# Patient Record
Sex: Female | Born: 1975
Health system: Southern US, Community
[De-identification: ages and names within clinical notes are randomized; demographics above are authoritative.]

## PROBLEM LIST (undated history)

## (undated) DIAGNOSIS — E785 Hyperlipidemia, unspecified: Secondary | ICD-10-CM

## (undated) DIAGNOSIS — R809 Proteinuria, unspecified: Secondary | ICD-10-CM

## (undated) DIAGNOSIS — E1165 Type 2 diabetes mellitus with hyperglycemia: Secondary | ICD-10-CM

## (undated) DIAGNOSIS — Z6841 Body Mass Index (BMI) 40.0 and over, adult: Secondary | ICD-10-CM

## (undated) HISTORY — DX: Hyperlipidemia, unspecified: E78.5

## (undated) HISTORY — DX: Body Mass Index (BMI) 40.0 and over, adult: Z684

## (undated) HISTORY — DX: Morbid (severe) obesity due to excess calories: E66.01

## (undated) HISTORY — DX: Proteinuria, unspecified: R80.9

## (undated) HISTORY — DX: Type 2 diabetes mellitus with hyperglycemia: E11.65

---

## 2007-05-21 DIAGNOSIS — E1165 Type 2 diabetes mellitus with hyperglycemia: Secondary | ICD-10-CM

## 2007-05-21 HISTORY — DX: Type 2 diabetes mellitus with hyperglycemia: E11.65

## 2007-06-28 HISTORY — PX: TUBAL LIGATION: SHX77

## 2012-10-12 ENCOUNTER — Emergency Department (HOSPITAL_COMMUNITY)
Admission: EM | Admit: 2012-10-12 | Discharge: 2012-10-12 | Disposition: A | Discharge status: Home / Self Care | Payer: Self-pay | Attending: Emergency Medicine | Admitting: Emergency Medicine

## 2012-10-12 ENCOUNTER — Encounter (HOSPITAL_COMMUNITY): Payer: Self-pay | Admitting: Emergency Medicine

## 2012-10-12 ENCOUNTER — Emergency Department (HOSPITAL_COMMUNITY): Payer: Self-pay

## 2012-10-12 DIAGNOSIS — R0781 Pleurodynia: Secondary | ICD-10-CM

## 2012-10-12 DIAGNOSIS — IMO0002 Reserved for concepts with insufficient information to code with codable children: Secondary | ICD-10-CM | POA: Insufficient documentation

## 2012-10-12 DIAGNOSIS — M25561 Pain in right knee: Secondary | ICD-10-CM

## 2012-10-12 DIAGNOSIS — S99919A Unspecified injury of unspecified ankle, initial encounter: Secondary | ICD-10-CM | POA: Insufficient documentation

## 2012-10-12 DIAGNOSIS — E669 Obesity, unspecified: Secondary | ICD-10-CM | POA: Insufficient documentation

## 2012-10-12 DIAGNOSIS — S298XXA Other specified injuries of thorax, initial encounter: Secondary | ICD-10-CM | POA: Insufficient documentation

## 2012-10-12 DIAGNOSIS — M7918 Myalgia, other site: Secondary | ICD-10-CM

## 2012-10-12 DIAGNOSIS — Y92009 Unspecified place in unspecified non-institutional (private) residence as the place of occurrence of the external cause: Secondary | ICD-10-CM | POA: Insufficient documentation

## 2012-10-12 DIAGNOSIS — S0990XA Unspecified injury of head, initial encounter: Secondary | ICD-10-CM | POA: Insufficient documentation

## 2012-10-12 DIAGNOSIS — S8990XA Unspecified injury of unspecified lower leg, initial encounter: Secondary | ICD-10-CM | POA: Insufficient documentation

## 2012-10-12 DIAGNOSIS — R296 Repeated falls: Secondary | ICD-10-CM | POA: Insufficient documentation

## 2012-10-12 DIAGNOSIS — Y9389 Activity, other specified: Secondary | ICD-10-CM | POA: Insufficient documentation

## 2012-10-12 DIAGNOSIS — S59909A Unspecified injury of unspecified elbow, initial encounter: Secondary | ICD-10-CM | POA: Insufficient documentation

## 2012-10-12 DIAGNOSIS — S6990XA Unspecified injury of unspecified wrist, hand and finger(s), initial encounter: Secondary | ICD-10-CM | POA: Insufficient documentation

## 2012-10-12 MED ORDER — CYCLOBENZAPRINE HCL 10 MG PO TABS: 10.0000 mg | ORAL_TABLET | Freq: Two times a day (BID) | ORAL | Status: DC | PRN

## 2012-10-12 MED ORDER — OXYCODONE-ACETAMINOPHEN 5-325 MG PO TABS
2.0000 | ORAL_TABLET | Freq: Once | ORAL | Status: AC
  Administered 2012-10-12: 2 via ORAL
  Filled 2012-10-12: qty 2

## 2012-10-12 MED ORDER — NAPROXEN 500 MG PO TABS: 500.0000 mg | ORAL_TABLET | Freq: Two times a day (BID) | ORAL | Status: DC

## 2012-10-12 NOTE — ED Provider Notes (Signed)
History     CSN: [id number]  Arrival date & time 10/12/12  8295   First MD Initiated Contact with Patient 10/12/12 0957      Chief Complaint  Patient presents with  . Fall    (Consider location/radiation/quality/duration/timing/severity/associated sxs/prior treatment) HPI Comments: 37 y.o. Female with no medical hx presents today s/p falling in her home on Saturday. No LOC. Pt has been ambulatory since the fall, though she states pain is increasing. Her chief complaint is severe pain to the left rib area and right knee with minor pain to the top of her head, lower back, and left wrist. Pt states it hurts to take a deep breath since the fall and she fears the rib is broken.   Patient is a 37 y.o. female presenting with fall. The history is provided by the patient.  Fall This is a new problem. Episode onset: 2 days ago. The problem occurs constantly. The problem has been gradually worsening. Associated symptoms include arthralgias. Pertinent negatives include no abdominal pain, chest pain, chills, coughing, diaphoresis, fever, headaches, nausea, neck pain, numbness, urinary symptoms, visual change, vomiting or weakness. Associated symptoms comments: Right knee, left wrist. Exacerbated by: moving around, deep breath. She has tried NSAIDs for the symptoms. The treatment provided no relief.    History reviewed. No pertinent past medical history.  Past Surgical History  Procedure Laterality Date  . Cesarean section      No family history on file.  History  Substance Use Topics  . Smoking status: Never Smoker   . Smokeless tobacco: Not on file  . Alcohol Use: No    OB History   Grav Para Term Preterm Abortions TAB SAB Ect Mult Living                  Review of Systems  Constitutional: Negative for fever, chills and diaphoresis.  HENT: Negative for neck pain.   Respiratory: Negative for cough.   Cardiovascular: Negative for chest pain.  Gastrointestinal: Negative for nausea,  vomiting and abdominal pain.  Musculoskeletal: Positive for arthralgias.  Neurological: Negative for weakness, numbness and headaches.    Allergies  Review of patient's allergies indicates no known allergies.  Home Medications  No current outpatient prescriptions on file.  BP 130/46  Pulse 75  Temp(Src) 98 F (36.7 C) (Oral)  Resp 22  SpO2 98%  Physical Exam  Nursing note and vitals reviewed. Constitutional: She is oriented to person, place, and time. She appears well-developed and well-nourished. No distress.  obese  HENT:  Head: Normocephalic and atraumatic.  Eyes: Conjunctivae and EOM are normal. Pupils are equal, round, and reactive to light.  Neck: Normal range of motion. Neck supple.  No meningeal signs  Cardiovascular: Normal rate, regular rhythm, normal heart sounds and intact distal pulses.  Exam reveals no gallop and no friction rub.   No murmur heard. Pulmonary/Chest: Effort normal and breath sounds normal. No respiratory distress. She has no wheezes. She has no rales. She exhibits tenderness.  Left sided pain on palpation to lateral chest wall. Pt unable to take deep breaths due to pain, but not tachypnic on exam with resps 18-20  Abdominal: Soft. Bowel sounds are normal. She exhibits no distension. There is no tenderness. There is no rebound and no guarding.  Musculoskeletal: Normal range of motion. She exhibits no edema and no tenderness.  5/5 strength throughout. Mild swelling to right knee. No erythema. No warmth. No effusion. Good quadricep strength on straight leg raise. No joint  laxity.  Neurological: She is alert and oriented to person, place, and time. No cranial nerve deficit.  Sensation normal to light touch and two point discrimination Moves extremities without ataxia, coordination intact Normal gait and balance, slight limp Normal strength in upper and lower extremities bilaterally including dorsiflexion and plantar flexion, strong and equal grip  strength   Skin: Skin is warm and dry. She is not diaphoretic. No erythema.  Psychiatric: She has a normal mood and affect.    ED Course  Procedures (including critical care time) Medications  oxyCODONE-acetaminophen (PERCOCET/ROXICET) 5-325 MG per tablet 2 tablet (2 tablets Oral Given 10/12/12 1120)    Labs Reviewed - No data to display Dg Ribs Unilateral W/chest Left  10/12/2012   *RADIOLOGY REPORT*  Clinical Data: Fall.  Short of breath.  Left anterior rib pain.  LEFT RIBS AND CHEST - 3+ VIEW  Comparison: None.  Findings: Heart size is at the upper limits of normal.  Mediastinal shadows are normal.  The lungs are clear allowing for the poor inspiration.  No pneumothorax or hemothorax.  Left rib films do not show any rib fracture.  IMPRESSION: Negative radiographs   Original Report Authenticated By: Paulina Fusi, M.D.   Dg Knee Complete 4 Views Right  10/12/2012   *RADIOLOGY REPORT*  Clinical Data: Larey Seat.  Right knee pain.  RIGHT KNEE - COMPLETE 4+ VIEW  Comparison: None  Findings: The joint spaces are maintained.  No acute fracture or joint effusion.  IMPRESSION: No acute bony findings, degenerative changes or joint effusion.   Original Report Authenticated By: Rudie Meyer, M.D.     1. Right knee pain   2. Rib pain on left side   3. Musculoskeletal pain       MDM  Imaging shows no fracture. Knee immobilizer and crutches ordered. Directed pt to ice, elevate and rest the injury when possible. Sent home with Naprosyn and Flexerill.  At this time there does not appear to be any evidence of an acute emergency medical condition and the patient appears stable for discharge with appropriate outpatient follow up.Diagnosis was discussed with patient who verbalizes understanding and is agreeable to discharge.   Glade Nurse, PA-C 10/12/12 2333

## 2012-10-12 NOTE — ED Notes (Signed)
Pt reports falling in her kitchen Saturday morning onto her left side hurting her left knee, hip, ribs and head. No LOC and reports pain has not gotten better. Denies n/v. NAD at this time

## 2012-10-13 NOTE — ED Provider Notes (Signed)
Medical screening examination/treatment/procedure(s) were performed by non-physician practitioner and as supervising physician I was immediately available for consultation/collaboration.   Charlane Westry Y. Makaylin Carlo, MD 10/13/12 0821 

## 2013-01-22 ENCOUNTER — Ambulatory Visit: Payer: Self-pay | Admitting: Family Medicine

## 2013-01-22 VITALS — BP 114/74 | HR 65 | Temp 98.8°F | Resp 20 | Ht 60.0 in | Wt 250.0 lb

## 2013-01-22 DIAGNOSIS — L309 Dermatitis, unspecified: Secondary | ICD-10-CM

## 2013-01-22 DIAGNOSIS — N61 Mastitis without abscess: Secondary | ICD-10-CM

## 2013-01-22 DIAGNOSIS — L259 Unspecified contact dermatitis, unspecified cause: Secondary | ICD-10-CM

## 2013-01-22 DIAGNOSIS — N611 Abscess of the breast and nipple: Secondary | ICD-10-CM

## 2013-01-22 MED ORDER — DOXYCYCLINE HYCLATE 100 MG PO TABS: 100.0000 mg | ORAL_TABLET | Freq: Two times a day (BID) | ORAL | Status: DC

## 2013-01-22 MED ORDER — TRIAMCINOLONE ACETONIDE 0.1 % EX CREA: TOPICAL_CREAM | Freq: Two times a day (BID) | CUTANEOUS | Status: DC

## 2013-01-22 NOTE — Patient Instructions (Addendum)
Take the doxycycline twice a day for your breast infection.   We will be in touch regarding your mammogram referral Try the cream on your feet twice a day.    Be sure to let me know if you do not hear about your mammogram appointment!

## 2013-01-22 NOTE — Progress Notes (Signed)
[facility] [address], [place] [address] [phone]  Date:  01/22/2013   Name:  Mackenzie Ford   DOB:  [birth year]   MRN:  [id number]  PCP:  No PCP Per Patient    Chief Complaint: Breast Pain   History of Present Illness:  Mackenzie Ford is a 37 y.o. very pleasant female patient who presents with the following:  She has noted a lump in her breast for about 2 weeks. It does hurt, and has bled.  It got bigger and more sore, then seemed to burst and now is less tender.   Otherwise she feels well.   Her LMP was about 2 weeks ago- she has had a BTL so there is no current risk of pregnancy She is otherwise healthy except for obesity  There are no active problems to display for this patient.   History reviewed. No pertinent past medical history.  Past Surgical History  Procedure Laterality Date  . Cesarean section      History  Substance Use Topics  . Smoking status: Never Smoker   . Smokeless tobacco: Not on file  . Alcohol Use: No    History reviewed. No pertinent family history.  No Known Allergies  Medication list has been reviewed and updated.  Current Outpatient Prescriptions on File Prior to Visit  Medication Sig Dispense Refill  . cyclobenzaprine (FLEXERIL) 10 MG tablet Take 1 tablet (10 mg total) by mouth 2 (two) times daily as needed for muscle spasms.  20 tablet  0  . naproxen (NAPROSYN) 500 MG tablet Take 1 tablet (500 mg total) by mouth 2 (two) times daily with a meal.  30 tablet  1   No current facility-administered medications on file prior to visit.    Review of Systems:  As per HPI- otherwise negative.   Physical Examination: Filed Vitals:   01/22/13 1152  BP: 114/74  Pulse: 65  Temp: 98.8 F (37.1 C)  Resp: 20   Filed Vitals:   01/22/13 1152  Height: 5' (1.524 m)  Weight: 250 lb (113.399 kg)   Body mass index is 48.82 kg/(m^2). Ideal Body Weight: Weight in (lb) to have BMI = 25: 127.7  GEN: WDWN, NAD,  Non-toxic, A & O x 3, morbid obesity HEENT: Atraumatic, Normocephalic. Neck supple. No masses, No LAD. Ears and Nose: No external deformity. CV: RRR, No M/G/R. No JVD. No thrill. No extra heart sounds. PULM: CTA B, no wheezes, crackles, rhonchi. No retractions. No resp. distress. No accessory muscle use. Breast; the right breast shows an already spontaneously drained abscess at 6:00.  There is no persistent drainage, no redness or heat.   ABD: S, NT, ND, +BS. No rebound. No HSM. EXTR: No c/c/e NEURO Normal gait.  PSYCH: Normally interactive. Conversant. Not depressed or anxious appearing.  Calm demeanor.  She is also concerned about chronic dermatitis on her bilateral feet  Assessment and Plan: Abscess of breast - Plan: doxycycline (VIBRA-TABS) 100 MG tablet, Ambulatory referral to Breast Clinic  Chronic dermatitis of feet - Plan: triamcinolone cream (KENALOG) 0.1 %  Suspect her breast lesion represents a spontaneously drained abscess.  Cover with doxycycline.  Will refer for a mammogram Trial of triamcinolone for chronic dermatitis of her feet.    Signed Abbe Amsterdam, MD

## 2013-11-25 ENCOUNTER — Emergency Department (HOSPITAL_COMMUNITY)
Admission: EM | Admit: 2013-11-25 | Discharge: 2013-11-26 | Disposition: A | Discharge status: Home / Self Care | Payer: Self-pay | Attending: Emergency Medicine | Admitting: Emergency Medicine

## 2013-11-25 ENCOUNTER — Encounter (HOSPITAL_COMMUNITY): Payer: Self-pay | Admitting: Emergency Medicine

## 2013-11-25 DIAGNOSIS — N949 Unspecified condition associated with female genital organs and menstrual cycle: Secondary | ICD-10-CM | POA: Insufficient documentation

## 2013-11-25 DIAGNOSIS — N938 Other specified abnormal uterine and vaginal bleeding: Secondary | ICD-10-CM | POA: Insufficient documentation

## 2013-11-25 DIAGNOSIS — Z3183 Encounter for assisted reproductive fertility procedure cycle: Secondary | ICD-10-CM | POA: Insufficient documentation

## 2013-11-25 DIAGNOSIS — Z3202 Encounter for pregnancy test, result negative: Secondary | ICD-10-CM | POA: Insufficient documentation

## 2013-11-25 DIAGNOSIS — N939 Abnormal uterine and vaginal bleeding, unspecified: Secondary | ICD-10-CM

## 2013-11-25 DIAGNOSIS — Z9889 Other specified postprocedural states: Secondary | ICD-10-CM | POA: Insufficient documentation

## 2013-11-25 DIAGNOSIS — N390 Urinary tract infection, site not specified: Secondary | ICD-10-CM | POA: Insufficient documentation

## 2013-11-25 DIAGNOSIS — N925 Other specified irregular menstruation: Secondary | ICD-10-CM | POA: Insufficient documentation

## 2013-11-25 LAB — CBC
HCT: 39.6 % (ref 36.0–46.0)
HEMOGLOBIN: 12.9 g/dL (ref 12.0–15.0)
MCH: 29.2 pg (ref 26.0–34.0)
MCHC: 32.6 g/dL (ref 30.0–36.0)
MCV: 89.6 fL (ref 78.0–100.0)
PLATELETS: 394 10*3/uL (ref 150–400)
RBC: 4.42 MIL/uL (ref 3.87–5.11)
RDW: 12.9 % (ref 11.5–15.5)
WBC: 13.5 10*3/uL — AB (ref 4.0–10.5)

## 2013-11-25 LAB — BASIC METABOLIC PANEL
ANION GAP: 13 (ref 5–15)
BUN: 11 mg/dL (ref 6–23)
CHLORIDE: 104 meq/L (ref 96–112)
CO2: 25 mEq/L (ref 19–32)
CREATININE: 0.54 mg/dL (ref 0.50–1.10)
Calcium: 8.8 mg/dL (ref 8.4–10.5)
Glucose, Bld: 116 mg/dL — ABNORMAL HIGH (ref 70–99)
POTASSIUM: 4 meq/L (ref 3.7–5.3)
Sodium: 142 mEq/L (ref 137–147)

## 2013-11-25 LAB — URINALYSIS, ROUTINE W REFLEX MICROSCOPIC
GLUCOSE, UA: NEGATIVE mg/dL
Ketones, ur: 15 mg/dL — AB
NITRITE: NEGATIVE
PROTEIN: 100 mg/dL — AB
Specific Gravity, Urine: 1.043 — ABNORMAL HIGH (ref 1.005–1.030)
Urobilinogen, UA: 1 mg/dL (ref 0.0–1.0)
pH: 5.5 (ref 5.0–8.0)

## 2013-11-25 LAB — URINE MICROSCOPIC-ADD ON

## 2013-11-25 LAB — WET PREP, GENITAL
Trich, Wet Prep: NONE SEEN
WBC, Wet Prep HPF POC: NONE SEEN
YEAST WET PREP: NONE SEEN

## 2013-11-25 LAB — POC URINE PREG, ED: PREG TEST UR: NEGATIVE

## 2013-11-25 MED ORDER — ONDANSETRON HCL 4 MG/2ML IJ SOLN
4.0000 mg | Freq: Once | INTRAMUSCULAR | Status: AC
  Administered 2013-11-25: 4 mg via INTRAVENOUS
  Filled 2013-11-25: qty 2

## 2013-11-25 MED ORDER — CEPHALEXIN 500 MG PO CAPS: 500.0000 mg | ORAL_CAPSULE | Freq: Four times a day (QID) | ORAL | Status: DC

## 2013-11-25 MED ORDER — PHENAZOPYRIDINE HCL 200 MG PO TABS: 200.0000 mg | ORAL_TABLET | Freq: Three times a day (TID) | ORAL | Status: DC

## 2013-11-25 MED ORDER — HYDROMORPHONE HCL PF 1 MG/ML IJ SOLN
1.0000 mg | Freq: Once | INTRAMUSCULAR | Status: AC
  Administered 2013-11-25: 1 mg via INTRAVENOUS
  Filled 2013-11-25: qty 1

## 2013-11-25 MED ORDER — PROCHLORPERAZINE EDISYLATE 5 MG/ML IJ SOLN
10.0000 mg | Freq: Once | INTRAMUSCULAR | Status: AC
  Administered 2013-11-25: 10 mg via INTRAVENOUS
  Filled 2013-11-25: qty 2

## 2013-11-25 MED ORDER — DEXTROSE 5 % IV SOLN
1.0000 g | Freq: Once | INTRAVENOUS | Status: AC
  Administered 2013-11-25: 1 g via INTRAVENOUS
  Filled 2013-11-25: qty 10

## 2013-11-25 MED ORDER — ONDANSETRON HCL 4 MG/2ML IJ SOLN
4.0000 mg | Freq: Once | INTRAMUSCULAR | Status: AC
Start: 2013-11-25 — End: 2013-11-25
  Administered 2013-11-25: 4 mg via INTRAVENOUS
  Filled 2013-11-25: qty 2

## 2013-11-25 MED ORDER — HYDROCODONE-ACETAMINOPHEN 5-325 MG PO TABS: 1.0000 | ORAL_TABLET | Freq: Four times a day (QID) | ORAL | Status: DC | PRN

## 2013-11-25 MED ORDER — DIPHENHYDRAMINE HCL 50 MG/ML IJ SOLN
25.0000 mg | Freq: Once | INTRAMUSCULAR | Status: AC
  Administered 2013-11-25: 25 mg via INTRAVENOUS
  Filled 2013-11-25: qty 1

## 2013-11-25 NOTE — ED Provider Notes (Signed)
Medical screening examination/treatment/procedure(s) were performed by non-physician practitioner and as supervising physician I was immediately available for consultation/collaboration.   EKG Interpretation None      Devoria AlbeIva Demetra Moya, MD, Mackenzie GangFACEP   Ward GivensIva L Mashayla Lavin, MD 11/25/13 [phone]

## 2013-11-25 NOTE — ED Provider Notes (Signed)
CSN: [id number]     Arrival date & time 11/25/13  1728 History   First MD Initiated Contact with Patient 11/25/13 1839     Chief Complaint  Patient presents with  . Vaginal Bleeding    with pain     (Consider location/radiation/quality/duration/timing/severity/associated sxs/prior Treatment) HPI Comments: This is a 38 y/o female with 2 days ofvaginal bleeding and pain. She states that her periods come very irregularly and is menstrual period was 6 months ago. Patient states that she normally has very heavy bleeding during her period and her bleeding today is similar to her previous periods. She complains of vaginal plane pain and suprapubic pain. She denies any urinary symptoms, vaginal discharge, dyspareunia. She denies any history of fibroids. She is a bilateral tubal ligation. She denies fevers, chills, nausea, vomiting, myalgias or arthralgias  Patient is a 38 y.o. female presenting with vaginal bleeding. The history is provided by the patient. The history is limited by a language barrier. A language interpreter was used.  Vaginal Bleeding Quality:  Clots, dark red and typical of menses Severity:  Moderate Onset quality:  Gradual Duration:  2 days Timing:  Constant Progression:  Unchanged Chronicity:  New Menstrual history:  Irregular Number of pads used:  10 Number of tampons used:  0 Possible pregnancy: no   Relieved by:  Nothing Associated symptoms: abdominal pain   Associated symptoms: no back pain, no dizziness, no dyspareunia, no dysuria, no fatigue, no fever, no nausea and no vaginal discharge   Abdominal pain:    Location:  Suprapubic   Quality:  Aching   Severity:  Moderate   Duration:  2 days   Timing:  Constant   Progression:  Unchanged Risk factors: gynecological surgery (ubal ligation)   Risk factors: no bleeding disorder, no hx of ectopic pregnancy, no hx of endometriosis, no new sexual partner, no ovarian cysts, no ovarian torsion, no PID, no prior miscarriage,  no STD, no STD exposure and no terminated pregnancies     History reviewed. No pertinent past medical history. Past Surgical History  Procedure Laterality Date  . Cesarean section     No family history on file. History  Substance Use Topics  . Smoking status: Never Smoker   . Smokeless tobacco: Not on file  . Alcohol Use: No   OB History   Grav Para Term Preterm Abortions TAB SAB Ect Mult Living                 Review of Systems  Constitutional: Negative for fever and fatigue.  Gastrointestinal: Positive for abdominal pain. Negative for nausea.  Genitourinary: Positive for vaginal bleeding. Negative for dysuria, vaginal discharge and dyspareunia.  Musculoskeletal: Negative for back pain.  Skin: Negative for rash.  Neurological: Negative for dizziness.      Allergies  Review of patient's allergies indicates no known allergies.  Home Medications   Prior to Admission medications   Medication Sig Start Date End Date Taking? Authorizing Provider  acetaminophen (TYLENOL) 500 MG tablet Take 1,000 mg by mouth every 6 (six) hours as needed for mild pain.   Yes Historical Provider, MD   BP 117/59  Pulse 65  Temp(Src) 98.2 F (36.8 C) (Oral)  Resp 28  SpO2 97% Physical Exam  Constitutional: She is oriented to person, place, and time. She appears well-developed and well-nourished. No distress.  HENT:  Head: Normocephalic and atraumatic.  Eyes: Conjunctivae are normal. No scleral icterus.  Neck: Normal range of motion.  Cardiovascular: Normal rate,  regular rhythm and normal heart sounds.  Exam reveals no gallop and no friction rub.   No murmur heard. Pulmonary/Chest: Effort normal and breath sounds normal. No respiratory distress.  Abdominal: Soft. Bowel sounds are normal. She exhibits no distension and no mass. There is tenderness (suprapubic). There is no guarding.  Genitourinary:  Pelvic exam: VULVA: normal appearing vulva with no masses, tenderness or lesions, VAGINA:  normal appearing vagina with normal color and discharge, no lesions, CERVIX: normal appearing cervix without discharge or lesions, lesion present Normal appearing cervix with bleeding noted, UTERUS: uterus is normal size, shape, consistency and nontender, unable to palpate uterus due to body habitus, exam limited by bleeding, pain, body habitus, exam chaperoned    Neurological: She is alert and oriented to person, place, and time.  Skin: Skin is warm and dry. She is not diaphoretic.    ED Course  Procedures (including critical care time) Labs Review Labs Reviewed  WET PREP, GENITAL - Abnormal; Notable for the following:    Clue Cells Wet Prep HPF POC RARE (*)    All other components within normal limits  CBC - Abnormal; Notable for the following:    WBC 13.5 (*)    All other components within normal limits  URINALYSIS, ROUTINE W REFLEX MICROSCOPIC - Abnormal; Notable for the following:    Color, Urine RED (*)    APPearance TURBID (*)    Specific Gravity, Urine 1.043 (*)    Hgb urine dipstick LARGE (*)    Bilirubin Urine MODERATE (*)    Ketones, ur 15 (*)    Protein, ur 100 (*)    Leukocytes, UA MODERATE (*)    All other components within normal limits  BASIC METABOLIC PANEL - Abnormal; Notable for the following:    Glucose, Bld 116 (*)    All other components within normal limits  URINE MICROSCOPIC-ADD ON - Abnormal; Notable for the following:    Bacteria, UA FEW (*)    All other components within normal limits  URINE CULTURE  GC/CHLAMYDIA PROBE AMP  POC URINE PREG, ED    Imaging Review No results found.   EKG Interpretation None      MDM   Final diagnoses:  UTI (lower urinary tract infection)  Abnormal vaginal bleeding    Filed Vitals:   11/25/13 1915 11/25/13 1945 11/25/13 2015 11/25/13 2100  BP: 120/69 121/62 119/63 117/59  Pulse: 68 68 69 65  Temp:      TempSrc:      Resp: 23 17 19 28   SpO2: 100% 99% 99% 97%   Patient with Urine contamination, however  she has large leukocytes and I believe this is the cause of her suprapubic pain. Her pelvic exam is otherwise with no cervical motion tenderness, adnexal tenderness. She does have diffuse vaginal tenderness especially on the anterior wall distended the bladder. The orifices to 2 urinary tract infection. Patient will be discharged with Keflex, Pyridium and pain medications. Have asked that she follow up with him in the outpatient clinic at [facility] for further evaluation of her abnormal uterine bleeding.     Arthor CaptainAbigail Stepheny Canal, PA-C 11/25/13 2312

## 2013-11-25 NOTE — Discharge Instructions (Signed)
Infeccin urinaria  (Urinary Tract Infection)  La infeccin urinaria puede ocurrir en Corporate treasurer del tracto urinario. El tracto urinario es un sistema de drenaje del cuerpo por el que se eliminan los desechos y el exceso de [place]. El tracto urinario est formado por dos riones, dos urteres, la vejiga y Engineer, mining. Los riones son rganos que tienen forma de frijol. Cada rin tiene aproximadamente el tamao del puo. Estn situados debajo de las [place], uno a cada lado de la columna vertebral CAUSAS  La causa de la infeccin son los microbios, que son organismos microscpicos, que incluyen hongos, virus, y bacterias. Estos organismos son tan pequeos que slo pueden verse a travs del microscopio. Las bacterias son los microorganismos que ms comnmente causan infecciones urinarias.  SNTOMAS  Los sntomas pueden variar segn la edad y el sexo del paciente y por la ubicacin de la infeccin. Los sntomas en las mujeres jvenes incluyen la necesidad frecuente e intensa de orinar y una sensacin dolorosa de ardor en la vejiga o en la uretra durante la miccin. Las mujeres y los hombres mayores podrn sentir cansancio, temblores y debilidad y Futures trader musculares y Engineer, mining abdominal. Si tiene [place], puede significar que la infeccin est en los riones. Otros sntomas son dolor en la espalda o en los lados debajo de las [place], nuseas y vmitos.  DIAGNSTICO  Para diagnosticar una infeccin urinaria, el mdico le preguntar acerca de sus sntomas. [employer] una [place] de [place]. La muestra de orina se analiza para Engineer, manufacturing bacterias y glbulos blancos de Risk manager. Los glbulos blancos se forman en el organismo para ayudar a Artist las infecciones.  TRATAMIENTO  Por lo general, las infecciones urinarias pueden tratarse con medicamentos. Debido a que la [employer] de las infecciones son causadas por bacterias, por lo general pueden tratarse con antibiticos. La eleccin del  antibitico y la duracin del tratamiento depender de sus sntomas y el tipo de bacteria causante de la infeccin.  INSTRUCCIONES PARA EL CUIDADO EN EL HOGAR   Si le recetaron antibiticos, tmelos exactamente como su mdico le indique. Termine el medicamento aunque se sienta mejor despus de haber tomado slo algunos.  Beba gran cantidad de lquido para mantener la orina de tono claro o color amarillo plido.  Evite la cafena, el t y las [address]. Estas sustancias irritan la vejiga.  Vaciar la vejiga con frecuencia. Evite retener la orina durante largos perodos.  Vace la vejiga antes y despus de Management consultant.  Despus de mover el intestino, las mujeres deben higienizarse la regin perineal desde adelante hacia atrs. Use slo un papel tissue por vez. SOLICITE ATENCIN MDICA SI:   Siente dolor en la espalda.  Le sube la fiebre.  Los sntomas no mejoran luego de [address]. SOLICITE ATENCIN MDICA DE INMEDIATO SI:   Siente dolor intenso en la espalda o en la zona inferior del abdomen.  Comienza a sentir escalofros.  Tiene nuseas o vmitos.  Tiene una sensacin continua de quemazn o molestias al [employer]. ASEGRESE DE QUE:   Comprende estas instrucciones.  Controlar su enfermedad.  Solicitar ayuda de inmediato si no mejora o empeora. Document Released: 02/13/2005 Document Revised: 01/29/2012 [facility] Patient Information 2015 [place], [place]. This information is not intended to replace advice given to you by your health care provider. Make sure you discuss any questions you have with your health care provider. Sangrado uterino anormal (Abnormal Uterine Bleeding) El sangrado uterino anormal puede afectar a las mujeres que estn en  diversas etapas de la vida, desde adolescentes, mujeres frtiles y AMR Corporationmujeres embarazadas, hasta mujeres que han llegado a la Atticamenopausia. Hay diversas clases de sangrado uterino que se consideran anormales, entre  ellas:  Prdidas de sangre o Nationwide Mutual Insurancehemorragias entre los perodos.  Hemorragias luego de Sales promotion account executivemantener relaciones sexuales.  Sangrado abundante o ms que lo habitual.  Perodos que duran ms que lo normal.  Sangrado luego de la menopausia. Muchos casos de sangrado uterino anormal son leves y simples de tratar, mientras que otros son ms graves. El mdico debe evaluar cualquier clase de sangrado anormal. El tratamiento depender de la causa del sangrado. INSTRUCCIONES PARA EL CUIDADO EN EL HOGAR Controle su afeccin para ver si hay cambios. Las siguientes indicaciones ayudarn a Architectural technologistaliviar cualquier molestia que pueda sentir:  Evite las duchas vaginales y el uso de tampones segn las indicaciones del mdico.  Cmbiese las compresas con frecuencia. Deber hacerse exmenes plvicos regulares y pruebas de Papanicolaou. Cumpla con todas las visitas de control y Ciscoexmanes diagnsticos, segn le indique su mdico.  SOLICITE ATENCIN MDICA SI:   El sangrado dura ms de 1 semana.  Se siente mareada por momentos. SOLICITE ATENCIN MDICA DE INMEDIATO SI:   Se desmaya.  Debe cambiarse la compresa cada 15 a 30 minutos.  Siente dolor abdominal.  Lance Mussiene fiebre.  Se siente dbil o presenta sudoracin.  Elimina cogulos grandes por la vagina.  Comienza a sentir nuseas y vomita. ASEGRESE DE QUE:   Comprende estas instrucciones.  Controlar su afeccin.  Recibir ayuda de inmediato si no mejora o si empeora. Document Released: 05/06/2005 Document Revised: 05/11/2013 [facility] Patient Information 2015 [place]. This information is not intended to replace advice given to you by your health care provider. Make sure you discuss any questions you have with your health care provider.

## 2013-11-25 NOTE — ED Notes (Signed)
PT reports bleeding from vagina and vagina pain since yesterday. States pain feels like contractions and PT reports that she soaked 20 pads today

## 2013-11-25 NOTE — ED Notes (Signed)
Pt states that 2 months ago was checked for vaginal polyps.  Pt states period very irregular.  Pt states started having vaginal bleeding yesterday and this am developed vaginal pain.  Pt appears very uncomfortable and crying. Pt states she stayed all day in the bathroom with vaginal bleeding

## 2013-11-26 LAB — URINE CULTURE
COLONY COUNT: NO GROWTH
CULTURE: NO GROWTH

## 2013-11-26 LAB — GC/CHLAMYDIA PROBE AMP
CT Probe RNA: NEGATIVE
GC Probe RNA: NEGATIVE

## 2013-11-26 MED ORDER — ONDANSETRON HCL 4 MG PO TABS: 4.0000 mg | ORAL_TABLET | Freq: Three times a day (TID) | ORAL | Status: DC | PRN

## 2013-11-26 NOTE — ED Notes (Signed)
PT soaked washcloth with blood and 1 quarter sized clot. PA notified

## 2013-11-26 NOTE — ED Notes (Signed)
Patient was discharged with all personal items. 

## 2013-12-21 ENCOUNTER — Ambulatory Visit: Payer: Self-pay

## 2014-05-20 DIAGNOSIS — Z6841 Body Mass Index (BMI) 40.0 and over, adult: Secondary | ICD-10-CM | POA: Insufficient documentation

## 2014-05-20 HISTORY — DX: Body Mass Index (BMI) 40.0 and over, adult: Z684

## 2014-05-20 HISTORY — DX: Morbid (severe) obesity due to excess calories: E66.01

## 2014-05-31 ENCOUNTER — Encounter (HOSPITAL_COMMUNITY): Payer: Self-pay

## 2014-05-31 ENCOUNTER — Emergency Department (HOSPITAL_COMMUNITY)
Admission: EM | Admit: 2014-05-31 | Discharge: 2014-05-31 | Disposition: A | Discharge status: Home / Self Care | Payer: Self-pay | Attending: Emergency Medicine | Admitting: Emergency Medicine

## 2014-05-31 DIAGNOSIS — Z7982 Long term (current) use of aspirin: Secondary | ICD-10-CM | POA: Insufficient documentation

## 2014-05-31 DIAGNOSIS — M25532 Pain in left wrist: Secondary | ICD-10-CM | POA: Insufficient documentation

## 2014-05-31 DIAGNOSIS — Z79899 Other long term (current) drug therapy: Secondary | ICD-10-CM | POA: Insufficient documentation

## 2014-05-31 DIAGNOSIS — M654 Radial styloid tenosynovitis [de Quervain]: Secondary | ICD-10-CM | POA: Insufficient documentation

## 2014-05-31 MED ORDER — IBUPROFEN 400 MG PO TABS
800.0000 mg | ORAL_TABLET | Freq: Once | ORAL | Status: AC
  Administered 2014-05-31: 800 mg via ORAL
  Filled 2014-05-31: qty 2

## 2014-05-31 MED ORDER — HYDROCODONE-ACETAMINOPHEN 5-325 MG PO TABS: 1.0000 | ORAL_TABLET | Freq: Four times a day (QID) | ORAL | Status: DC | PRN

## 2014-05-31 MED ORDER — IBUPROFEN 800 MG PO TABS: 800.0000 mg | ORAL_TABLET | Freq: Three times a day (TID) | ORAL | Status: DC

## 2014-05-31 NOTE — ED Notes (Signed)
Pt stated that she injured her left hand at work last wed

## 2014-05-31 NOTE — Progress Notes (Signed)
Orthopedic Tech Progress Note Patient Details:  Mackenzie OverlieCarmen E Paez [birth year] [id number]  Ortho Devices Type of Ortho Device: Thumb spica splint Ortho Device/Splint Location: Velcro thumb spica Ortho Device/Splint Interventions: Application   Asia R Thompson 05/31/2014, 9:55 AM

## 2014-05-31 NOTE — Discharge Instructions (Signed)
Call for a follow up appointment with a Family or Primary Care Provider.  Return if Symptoms worsen.   Take medication as prescribed.  Ice your wrist and thumb 3-4 times a day, elevate above your heart to reduce swelling. Wear splint to reduce movement. Follow-up with a hand specialist for further evaluation and treatment of your hand and thumb pain.   Emergency Department Resource Guide 1) Find a Doctor and Pay Out of Pocket Although you won't have to find out who is covered by your insurance plan, it is a good idea to ask around and get recommendations. You will then need to call the office and see if the doctor you have chosen will accept you as a new patient and what types of options they offer for patients who are self-pay. Some doctors offer discounts or will set up payment plans for their patients who do not have insurance, but you will need to ask so you aren't surprised when you get to your appointment.  2) Contact Your Local Health Department Not all health departments have doctors that can see patients for sick visits, but many do, so it is worth a call to see if yours does. If you don't know where your local health department is, you can check in your phone book. The CDC also has a tool to help you locate your state's health department, and many state websites also have listings of all of their local health departments.  3) Find a Walk-in Clinic If your illness is not likely to be very severe or complicated, you may want to try a walk in clinic. These are popping up all over the country in pharmacies, drugstores, and shopping centers. They're usually staffed by nurse practitioners or physician assistants that have been trained to treat common illnesses and complaints. They're usually fairly quick and inexpensive. However, if you have serious medical issues or chronic medical problems, these are probably not your best option.  No Primary Care Doctor: - Call Health Connect at  [phone] -  they can help you locate a primary care doctor that  accepts your insurance, provides certain services, etc. - Physician Referral Service- [phone]  Chronic Pain Problems: Organization         Address  Phone   Notes  [facility]  [phone] Patients need to be referred by their primary care doctor.   Medication Assistance: Organization         Address  Phone   Notes  [facility] [address] [place], [place] [address] [phone] --Must be a resident of [facility] [facility] -- Must have NO insurance coverage whatsoever (no Medicaid/ Medicare, etc.) -- The pt. MUST have a primary care doctor that directs their care regularly and follows them in the community   MedAssist  [phone]   [employer]  [phone]    Agencies that provide inexpensive medical care: Organization         Address  Phone   Notes  [facility]  [phone]   [facility]    [phone]   [facility] [address] [place], [place] [address] [phone]   [facility] [address], [place] [phone]   [facility]anned Parenthood    [phone]   [facility]    [phone]   [facility]  [address], [place] Phone:  [phone], Fax:  [phone] Hours of Operation:  9 am - 6 pm, M-F.  Also accepts Medicaid/Medicare and self-pay.  [facility] for [address], [address], [place] Phone: [phone], Fax: [phone]. Hours of Operation:  8:30 am - 5:30 pm, M-F.  Also accepts Medicaid and self-pay.  [facility] [address] [place] Phone: [phone]   [place], [place], [place] [phone], Ext. [phone] Mondays & Thursdays: 7-9 AM.  First 15 patients are seen on a first come, first serve basis.    [place] Providers:  Organization         Address  Phone   Notes  [facility] [address], [place] [phone] Also accepts self-pay patients.  [facility] [place] [address], [place]  [phone]   [place] [place], [address], [place] [phone]   [facility] [address] [place] [phone]   Lucianne Lei [address], [place]   [phone] Only accepts [place] Access [place] patients after they have their name applied to their card.   Self-Pay (no insurance) in [facility]:  Organization         Address  Phone   Notes  Sickle Cell Patients, [facility] [place] [phone]   [facility] [place] [phone]   [facility] [place]  [place], [place], [place] [phone]   [facility]/Dr. Osei-Bonsu  [address], [place] or [address], [place] [phone] Phone number for both [place] and [place] locations is the same.  [facility] [address], [place] [phone]   [facility] [address], [place] or [address] [phone]   [facility] [address] [place] [phone], phone; [phone], fax Sees patients 1st and 3rd Saturday of every month.  Must not qualify for public or private insurance (i.e. Medicaid, Medicare, [place] Health Choice, Veterans' Benefits)  Household income should be no more than 200% of the poverty level The clinic cannot treat you if you are pregnant or think you are pregnant  Sexually transmitted diseases are not treated at the clinic.    Dental Care: Organization         Address  Phone  Notes  [facility] [place] [phone] Accepts children up to age 7 who are enrolled in [place] or [place]; pregnant women with a Medicaid card; and children who have applied for Medicaid or [place] Health Choice, but were declined, whose parents can pay a reduced fee at time of service.  [facility] of [facility]  [address], [place] [phone] Accepts children up to age 22 who are enrolled in [place] or [place]; pregnant women with a Medicaid card; and children who have applied for Medicaid or [place] Health Choice, but were declined, whose parents can pay a reduced fee at time of service.  [place] Adult [facility] Access PROGRAM  [place] [phone] Patients are seen by appointment only. Walk-ins  are not accepted. [place] will see patients 57 years of age and older. Monday - Tuesday (8am-5pm) Most Wednesdays (8:30-5pm) $30 per visit, cash only  [facility] Access PRO[facility]  [address], [facility] [phone] Patients are seen by appointment only. Walk-ins are not accepted. [place] will see patients 68 years of age and older. One Wednesday Evening (Monthly: Volunteer Based).  $30 per visit, cash only  [place]  [phone] for adults; Children under age 80, call Graduate Pediatric Dentistry at [phone]. Children aged 24-14, please call [phone] to request a pediatric application.  Dental services are provided in all areas of dental care including fillings, crowns and bridges, complete and partial dentures, implants, gum treatment, root canals, and extractions. Preventive care is also provided. Treatment is provided to both adults and children. Patients are selected via a lottery and there is often a waiting list.   [facility] [address] [place]  [phone] [email or web address]   [facility] [address] [place], [place] [phone], Ext. [phone] Second and Fourth Thursday of each month, opens at 6:30 AM; Clinic ends at 9 AM.  Patients are seen on a first-come first-served basis, and a limited number are seen during each clinic.   [facility]  [address] Hillard Danker [place], [place] [phone]   Eligibility Requirements You must have lived in [place], [place], or [place] counties for at least the last three months.   You cannot be eligible for state or federal sponsored [employer], including [employer], [place], or [employer].   You generally cannot be eligible for healthcare insurance through your employer.    How to apply: Eligibility screenings are held every Tuesday and Wednesday afternoon from 1:00 pm until 4:00 pm. You do not need an appointment for the interview!  [facility] [address], [place], [place]   [place]  [place] Department  [place]  [phone]    Behavioral Health Resources in the Community: Intensive Outpatient Programs Organization         Address  Phone  Notes  [place]. [address], [place], [place] [phone]   [facility] Outpatient [address], [place], [place]   ADS: Alcohol & Drug Svcs [address] [place], [place]   [place] [address],  [place], [place] or [phone]   Substance Abuse Resources Organization         Address  Phone  Notes  Alcohol and Drug Services  [phone]   [place]  [phone]   [facility]   Chinita Pester  [phone]   Residential & Outpatient Substance Abuse Program  [phone]   Psychological Services Organization         Address  Phone  Notes  [facility] [place]   [place]  [phone]   [place] [address], [place] [phone] or [phone]    Mobile Crisis Teams Organization         Address  Phone  Notes  [facility], Mobile Crisis Care Unit  [phone]   [facility]ive Psychotherapeutic Services  [address] [place], [place]   [facility] [address] [place] [phone]  Self-Help/Support Groups Organization         Address  Phone             Notes  [facility] [place] - variety of support groups  [place] Call for more information  Narcotics Anonymous (NA), [facility] [address], [employer] [place]  2 meetings at this location   Special educational needs teacher         Address  Phone  Notes  [facility] [place],    [place]  [phone]   [facility]  [address] [place] [address], [place], [place]   [place], [place] [phone] Admissions: 8am-3pm M-F  Incentives Substance [place] [address],    [place], [place] [phone]   [facility] [address] [place], [place], [place]   [facility] [address],  [place], [place]   [facility] Programs - Intensive Outpatient [address], [place], [place]   [facility] ([place].,  [place], [place] [phone] or [phone]   [facility] (RTS) [address] [place], [place] Accepts Medicaid  [facility] [place] [address],  [place] [phone] Substance Abuse/Addiction Treatment   [facility] Organization         Address  Phone  Notes  [facility]  [phone]   Domenic Schwab, PhD [address] [place], [place]   [phone] or  [phone]   [place] [employer], [place] [phone]   [facility] [address], [place], [place] [phone] Insurance/Medicaid/sponsorship through [facility] and Families [address], [address] [place]                                    [place], [place] [phone] [place] [address], [place] [phone]    Dr. Adele Schilder  [phone]   [facility] Dept. 1) [address], [place] 2) [place] 3)  [place] [address], [place] [phone]  [phone]   [place] [phone] or [phone] (After Hours)

## 2014-05-31 NOTE — ED Provider Notes (Signed)
CSN: [id number]     Arrival date & time 05/31/14  4098 History   First MD Initiated Contact with Patient 05/31/14 [phone]     Chief Complaint  Patient presents with  . Hand Pain     (Consider location/radiation/quality/duration/timing/severity/associated sxs/prior Treatment) HPI Comments: The patient is a 39 year old female presenting emergency room chief complaint of left thumb and wrist discomfort for approximately 6 days. Patient reports discomfort worsened with movement. 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  Reports having to grasp jars to pick them up while at work.  She is right hand dominant. Denies fall or trauma to the area. Patient reports taking Tylenol without resolution of symptoms.  LNMP: surgical No PCP  Patient is a 39 y.o. female presenting with hand pain. The history is provided by the patient. No language interpreter was used.  Hand Pain Associated symptoms include arthralgias and myalgias.    History reviewed. No pertinent past medical history. Past Surgical History  Procedure Laterality Date  . Cesarean section     History reviewed. No pertinent family history. History  Substance Use Topics  . Smoking status: Never Smoker   . Smokeless tobacco: Not on file  . Alcohol Use: No   OB History    No data available     Review of Systems  Musculoskeletal: Positive for myalgias and arthralgias.  Skin: Negative for color change and wound.      Allergies  Review of patient's allergies indicates no known allergies.  Home Medications   Prior to Admission medications   Medication Sig Start Date End Date Taking? Authorizing Provider  acetaminophen (TYLENOL) 500 MG tablet Take 1,000 mg by mouth every 6 (six) hours as needed for mild pain.    Historical Provider, MD  cephALEXin (KEFLEX) 500 MG capsule Take 1 capsule (500 mg total) by mouth 4 (four) times daily. 11/25/13   Arthor Captain, PA-C  HYDROcodone-acetaminophen (NORCO) 5-325 MG per tablet  Take 1-2 tablets by mouth every 6 (six) hours as needed for moderate pain. 11/25/13   Arthor Captain, PA-C  ondansetron (ZOFRAN) 4 MG tablet Take 1 tablet (4 mg total) by mouth every 8 (eight) hours as needed for nausea or vomiting. 11/26/13   Arthor Captain, PA-C  phenazopyridine (PYRIDIUM) 200 MG tablet Take 1 tablet (200 mg total) by mouth 3 (three) times daily. 11/25/13   Abigail Harris, PA-C   BP 140/53 mmHg  Pulse 84  Temp(Src) 97.6 F (36.4 C) (Oral)  Resp 16  SpO2 97% Physical Exam  Constitutional: She is oriented to person, place, and time. She appears well-developed and well-nourished. No distress.  HENT:  Head: Normocephalic and atraumatic.  Neck: Neck supple.  Pulmonary/Chest: Effort normal. No respiratory distress.  Musculoskeletal:       Left wrist: She exhibits tenderness.  Left  UE:  Positive Finkelstein  Neurological: She is alert and oriented to person, place, and time.  Skin: Skin is warm and dry. She is not diaphoretic.  Psychiatric: She has a normal mood and affect. Her behavior is normal.  Nursing note and vitals reviewed.   ED Course  Procedures (including critical care time) Labs Review Labs Reviewed - No data to display  Imaging Review No results found.   EKG Interpretation None      MDM   Final diagnoses:  De Quervain's tenosynovitis   Patient with nontraumatic pain of left thumb likely to de Quervain's tenosynovitis, plant to treat with RICE and splint. Ortho follow up as needed.  Resources given. Meds given in ED:  Medications  ibuprofen (ADVIL,MOTRIN) tablet 800 mg (800 mg Oral Given 05/31/14 0942)    New Prescriptions   HYDROCODONE-ACETAMINOPHEN (NORCO/VICODIN) 5-325 MG PER TABLET    Take 1 tablet by mouth every 6 (six) hours as needed for moderate pain or severe pain.   IBUPROFEN (ADVIL,MOTRIN) 800 MG TABLET    Take 1 tablet (800 mg total) by mouth 3 (three) times daily.       Mellody DrownLauren Conrado Nance, PA-C 05/31/14 [id number]  Vida RollerBrian D Miller,  MD 06/01/14 [phone]

## 2014-05-31 NOTE — ED Notes (Signed)
Declined W/C at D/C and was escorted to lobby by RN. 

## 2014-08-09 ENCOUNTER — Emergency Department (HOSPITAL_COMMUNITY): Payer: No Typology Code available for payment source

## 2014-08-09 ENCOUNTER — Emergency Department (HOSPITAL_COMMUNITY)
Admission: EM | Admit: 2014-08-09 | Discharge: 2014-08-09 | Disposition: A | Discharge status: Home / Self Care | Payer: No Typology Code available for payment source | Attending: Emergency Medicine | Admitting: Emergency Medicine

## 2014-08-09 ENCOUNTER — Encounter (HOSPITAL_COMMUNITY): Payer: Self-pay | Admitting: Neurology

## 2014-08-09 DIAGNOSIS — Y92410 Unspecified street and highway as the place of occurrence of the external cause: Secondary | ICD-10-CM | POA: Diagnosis not present

## 2014-08-09 DIAGNOSIS — S299XXA Unspecified injury of thorax, initial encounter: Secondary | ICD-10-CM | POA: Insufficient documentation

## 2014-08-09 DIAGNOSIS — S8992XA Unspecified injury of left lower leg, initial encounter: Secondary | ICD-10-CM | POA: Diagnosis not present

## 2014-08-09 DIAGNOSIS — Y998 Other external cause status: Secondary | ICD-10-CM | POA: Diagnosis not present

## 2014-08-09 DIAGNOSIS — Z792 Long term (current) use of antibiotics: Secondary | ICD-10-CM | POA: Diagnosis not present

## 2014-08-09 DIAGNOSIS — Z79899 Other long term (current) drug therapy: Secondary | ICD-10-CM | POA: Insufficient documentation

## 2014-08-09 DIAGNOSIS — M25562 Pain in left knee: Secondary | ICD-10-CM

## 2014-08-09 DIAGNOSIS — Y9389 Activity, other specified: Secondary | ICD-10-CM | POA: Insufficient documentation

## 2014-08-09 MED ORDER — TRAMADOL HCL 50 MG PO TABS
50.0000 mg | ORAL_TABLET | Freq: Once | ORAL | Status: AC
  Administered 2014-08-09: 50 mg via ORAL
  Filled 2014-08-09: qty 1

## 2014-08-09 MED ORDER — TRAMADOL HCL 50 MG PO TABS: 50.0000 mg | ORAL_TABLET | Freq: Four times a day (QID) | ORAL | Status: DC | PRN

## 2014-08-09 NOTE — Discharge Instructions (Signed)
Take Tramadol as needed for pain. Refer to attached documents for more information. Follow up with Dr. Brooks for further evaluation.  °
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
## 2015-03-31 ENCOUNTER — Ambulatory Visit (INDEPENDENT_AMBULATORY_CARE_PROVIDER_SITE_OTHER): Payer: Self-pay | Admitting: Obstetrics and Gynecology

## 2015-03-31 ENCOUNTER — Encounter: Payer: Self-pay | Admitting: Obstetrics and Gynecology

## 2015-03-31 VITALS — BP 116/46 | HR 59 | Temp 98.1°F | Ht 60.0 in | Wt 264.9 lb

## 2015-03-31 DIAGNOSIS — Z113 Encounter for screening for infections with a predominantly sexual mode of transmission: Secondary | ICD-10-CM

## 2015-03-31 DIAGNOSIS — N760 Acute vaginitis: Secondary | ICD-10-CM

## 2015-03-31 LAB — WET PREP, GENITAL
Trich, Wet Prep: NONE SEEN
YEAST WET PREP: NONE SEEN

## 2015-03-31 NOTE — Progress Notes (Signed)
Patient ID: Mackenzie BeersCarmen E Julian-Paez, female   DOB: [birth year], 39 y.o.   MRN: [id number] 39 yo [id number] here with a 1 year history of vaginal discharge. She reports the discharge as being intermittent initially but over the past few months has been [place], associated with pruritis and an odor. She states the discharge is white in color. She is sexually active with her husband. Her last pap smear was in [place] within the last year and was normal. She has not had a mammogram yet  History reviewed. No pertinent past medical history. Past Surgical History  Procedure Laterality Date  . Cesarean section    . Cesarean section    . Btl     Family History  Problem Relation Age of Onset  . Diabetes Mother   . Cancer Mother    Social History  Substance Use Topics  . Smoking status: Never Smoker   . Smokeless tobacco: Never Used  . Alcohol Use: No   ROS See pertinent in HPI  Blood pressure 116/46, pulse 59, temperature 98.1 F (36.7 C), height 5' (1.524 m), weight 264 lb 14.4 oz (120.158 kg), last menstrual period 01/30/2015. GENERAL: Well-developed, well-nourished female in no acute distress. Obese ABDOMEN: Soft, nontender, nondistended. No organomegaly. PELVIC: Normal external female genitalia. Vagina is pink and rugated.  Normal discharge. Normal appearing cervix. Bimanual exam limited secondary to body habitus EXTREMITIES: No cyanosis, clubbing, or edema, 2+ distal pulses.  A/P 39 yo with vaginitis - Wet prep and cultures collected - Discussed monitoring discharge with the use of different detergents, tampons/pads and body wash as they can all be irritants - Patient will be contacted with abnormal results - Screening mammogram ordered - RTC prn

## 2015-03-31 NOTE — Addendum Note (Signed)
Addended by: Sherre LainASH, Aaira Oestreicher A on: 03/31/2015 10:39 AM   Modules accepted: Orders

## 2015-04-03 LAB — GC/CHLAMYDIA PROBE AMP (~~LOC~~) NOT AT ARMC
CHLAMYDIA, DNA PROBE: NEGATIVE
Neisseria Gonorrhea: NEGATIVE

## 2015-04-03 MED ORDER — METRONIDAZOLE 500 MG PO TABS: 500.0000 mg | ORAL_TABLET | Freq: Two times a day (BID) | ORAL | Status: DC

## 2015-04-03 NOTE — Addendum Note (Signed)
Addended by: Catalina AntiguaONSTANT, Kareem Aul on: 04/03/2015 10:59 AM   Modules accepted: Orders

## 2015-04-04 ENCOUNTER — Telehealth: Payer: Self-pay | Admitting: General Practice

## 2015-04-04 NOTE — Telephone Encounter (Signed)
Patient has BV and Rx has been sent to pharmacy. Called patient with [employer] for interpreter, no answer- left message to call us back at the clinics for non urgent results.

## 2015-04-06 NOTE — Telephone Encounter (Addendum)
Mackenzie MylarCarmen called back and call transferred to nurse. Spoke with patient with interpreter Darletta Mollorita Arias. Explained to her that she has bacterial vaginosis and that prescription for flagyl sent to her pharmacy and that should clear up the BV. All questions answered and she voices understanding.

## 2015-04-06 NOTE — Telephone Encounter (Signed)
Mackenzie Ford with interpreter [employer] [place]. Left message we are calling with information . Please call clinic.

## 2015-05-21 DIAGNOSIS — E785 Hyperlipidemia, unspecified: Secondary | ICD-10-CM

## 2015-05-21 HISTORY — DX: Hyperlipidemia, unspecified: E78.5

## 2016-06-12 ENCOUNTER — Encounter: Payer: Self-pay | Admitting: Internal Medicine

## 2016-06-12 ENCOUNTER — Ambulatory Visit (INDEPENDENT_AMBULATORY_CARE_PROVIDER_SITE_OTHER): Payer: Self-pay | Admitting: Internal Medicine

## 2016-06-12 VITALS — BP 130/80 | HR 82 | Resp 12 | Ht 59.0 in | Wt 271.0 lb

## 2016-06-12 DIAGNOSIS — B373 Candidiasis of vulva and vagina: Secondary | ICD-10-CM

## 2016-06-12 DIAGNOSIS — B3731 Acute candidiasis of vulva and vagina: Secondary | ICD-10-CM

## 2016-06-12 DIAGNOSIS — E1165 Type 2 diabetes mellitus with hyperglycemia: Secondary | ICD-10-CM

## 2016-06-12 LAB — GLUCOSE, POCT (MANUAL RESULT ENTRY): POC GLUCOSE: 483 mg/dL — AB (ref 70–99)

## 2016-06-12 MED ORDER — METFORMIN HCL 500 MG PO TABS: 500.0000 mg | ORAL_TABLET | Freq: Two times a day (BID) | ORAL | 11 refills | Status: DC

## 2016-06-12 MED ORDER — AGAMATRIX PRESTO W/DEVICE KIT: PACK | 0 refills | Status: DC

## 2016-06-12 MED ORDER — GLIPIZIDE 5 MG PO TABS: 5.0000 mg | ORAL_TABLET | Freq: Two times a day (BID) | ORAL | 11 refills | Status: DC

## 2016-06-12 MED ORDER — FLUCONAZOLE 150 MG PO TABS: ORAL_TABLET | ORAL | 0 refills | Status: DC

## 2016-06-12 MED ORDER — GLUCOSE BLOOD VI STRP: ORAL_STRIP | 12 refills | Status: DC

## 2016-06-12 NOTE — Progress Notes (Signed)
Subjective:    Patient ID: Mackenzie Ford, female    DOB: 02/23/1976, 41 y.o.   MRN: [id number]  HPI   Here to establish.  1.  Vaginal discharge for 1 year.  Sometimes clear or white at other times yellow.  Now smells bad and looks like pus.  Lots of itching. No bleeding with intercourse, but can be painful.  No pelvic pain.   No period really for several months, though 3 months ago had spotting for 3-4 days.  Sounds like periods, however, have been irregular since the birth of her first son.   Has tried Vagisil without improvement Was treated for bacterial vaginosis back in November in 2016, but patient did not note improvement.  Outpatient Encounter Prescriptions as of 06/12/2016  Medication Sig  . acetaminophen (TYLENOL) 500 MG tablet Take 1,000 mg by mouth every 6 (six) hours as needed for mild pain.  . Blood Glucose Monitoring Suppl (AGAMATRIX PRESTO) w/Device KIT Check sugars twice daily before meals  . fluconazole (DIFLUCAN) 150 MG tablet 1 tab by mouth daily for 6 days.  Marland Kitchen glipiZIDE (GLUCOTROL) 5 MG tablet Take 1 tablet (5 mg total) by mouth 2 (two) times daily before a meal.  . glucose blood (AGAMATRIX PRESTO TEST) test strip Check sugars twice daily before meals  . ibuprofen (ADVIL,MOTRIN) 800 MG tablet Take 1 tablet (800 mg total) by mouth 3 (three) times daily. (Patient not taking: Reported on 03/31/2015)  . metFORMIN (GLUCOPHAGE) 500 MG tablet Take 1 tablet (500 mg total) by mouth 2 (two) times daily with a meal.  . [DISCONTINUED] metroNIDAZOLE (FLAGYL) 500 MG tablet Take 1 tablet (500 mg total) by mouth 2 (two) times daily. (Patient not taking: Reported on 06/12/2016)   No facility-administered encounter medications on file as of 06/12/2016.    No Known Allergies  Past Medical History:  Diagnosis Date  . Prediabetes 2009   Past Surgical History:  Procedure Laterality Date  . BTL    . CESAREAN SECTION    . CESAREAN SECTION    . CESAREAN SECTION     Family  History  Problem Relation Age of Onset  . Diabetes Mother   . Cancer Mother    Social History   Social History  . Marital status: Married    Spouse name: [name]  . Number of children: 3  . Years of education: 9   Occupational History  . Housewife    Social History Main Topics  . Smoking status: Never Smoker  . Smokeless tobacco: Never Used  . Alcohol use No  . Drug use: No  . Sexual activity: Yes    Partners: Male    Birth control/ protection: None, Surgical   Other Topics Concern  . Not on file   Social History Narrative   Originally from [place]   Came to [place] at home with husband who is patient here, [place], and their 3 children.        Review of Systems     Objective:   Physical Exam  Morbidly obese Lungs:  CTA CV:  RRR with normal S1 and S2, No S3, S4, or murmur.  Radial pulses normal and equal Abd:  S, NT, No HSM or mass, + BS GU:  External genitalia excoriated, erythematous with fissuring of entire vulvar area and in particular vaginal orifice. Scant white discharge.  Unable to insert speculum due to pain.  Chronic thickening of vulvar skin.  Fingerstick glucose checked following finding: Lab  Results  Component Value Date   POCGLU 483 (A) 06/12/2016          Assessment & Plan:   1.  Chronic Monilial Vulvovaginitis:  Fluconazole 150 mg once daily for 6 days.  To call if no gradual improvement.  Discussed how her new diagnosis of DM and obesity puts her at risk for yeast infection.  2.  New diagnosis of DM: Discussed diet and gradual increase of physical activity at length.  Start Glipizide 5 mg and Metformin 500 mg twice daily with meals.   Glucose journal discussed. Return in the morning for A1C, CMP, CBC, FLP, urine microalbumin/crea. Follow up with me in 1 month and to bring glucose journal.  3.  Morbid Obesity:  Already had a conversation regarding lifestyle changes with her husband.  Discussed at length need for everyone in  household to work on this together.  Information on improving habits given as well.

## 2016-06-12 NOTE — Patient Instructions (Signed)
Keeping track of sugars:  In a spiral bound notebook      Month Name (January) Day of month   B   L   D  BT 1   Time /sugar level    7:45 pm  150 2 3 4

## 2016-06-13 ENCOUNTER — Other Ambulatory Visit (INDEPENDENT_AMBULATORY_CARE_PROVIDER_SITE_OTHER): Payer: Self-pay

## 2016-06-13 DIAGNOSIS — Z79899 Other long term (current) drug therapy: Secondary | ICD-10-CM

## 2016-06-13 DIAGNOSIS — E1165 Type 2 diabetes mellitus with hyperglycemia: Secondary | ICD-10-CM

## 2016-06-14 LAB — LIPID PANEL W/O CHOL/HDL RATIO
Cholesterol, Total: 227 mg/dL — ABNORMAL HIGH (ref 100–199)
HDL: 25 mg/dL — ABNORMAL LOW (ref >39)
Triglycerides: 627 mg/dL (ref 0–149)

## 2016-06-14 LAB — COMPREHENSIVE METABOLIC PANEL
ALBUMIN: 3.7 g/dL (ref 3.5–5.5)
ALT: 77 IU/L — ABNORMAL HIGH (ref 0–32)
AST: 65 IU/L — ABNORMAL HIGH (ref 0–40)
Albumin/Globulin Ratio: 1.2 (ref 1.2–2.2)
Alkaline Phosphatase: 162 IU/L — ABNORMAL HIGH (ref 39–117)
BUN / CREAT RATIO: 18 (ref 9–23)
BUN: 12 mg/dL (ref 6–24)
Bilirubin Total: 0.2 mg/dL (ref 0.0–1.2)
CALCIUM: 8.5 mg/dL — AB (ref 8.7–10.2)
CO2: 22 mmol/L (ref 18–29)
Chloride: 97 mmol/L (ref 96–106)
Creatinine, Ser: 0.66 mg/dL (ref 0.57–1.00)
GFR calc Af Amer: 126 mL/min/{1.73_m2} (ref >59)
GFR, EST NON AFRICAN AMERICAN: 109 mL/min/{1.73_m2} (ref >59)
GLOBULIN, TOTAL: 3.2 g/dL (ref 1.5–4.5)
GLUCOSE: 356 mg/dL — AB (ref 65–99)
Potassium: 4.5 mmol/L (ref 3.5–5.2)
SODIUM: 137 mmol/L (ref 134–144)
Total Protein: 6.9 g/dL (ref 6.0–8.5)

## 2016-06-14 LAB — MICROALBUMIN / CREATININE URINE RATIO
Creatinine, Urine: 34.6 mg/dL
Microalb/Creat Ratio: 148.6 mg/g creat — ABNORMAL HIGH (ref 0.0–30.0)
Microalbumin, Urine: 51.4 ug/mL

## 2016-06-14 LAB — HGB A1C W/O EAG: Hgb A1c MFr Bld: 12.8 % — ABNORMAL HIGH (ref 4.8–5.6)

## 2016-06-25 ENCOUNTER — Other Ambulatory Visit: Payer: Self-pay | Admitting: Licensed Clinical Social Worker

## 2016-07-02 ENCOUNTER — Ambulatory Visit: Payer: Self-pay | Admitting: Licensed Clinical Social Worker

## 2016-07-02 DIAGNOSIS — F439 Reaction to severe stress, unspecified: Secondary | ICD-10-CM

## 2016-07-03 NOTE — Progress Notes (Signed)
   THERAPY PROGRESS NOTE  Session Time: 60min  Participation Level: Active  Behavioral Response: Casual and Fairly GroomedAlertEuthymic  Type of Therapy: Individual Therapy  Treatment Goals addressed: Coping  Interventions: Motivational Interviewing and Supportive  Summary: Mackenzie Ford is a 41 y.o. female who presents with a positive mood and appropriate affect. She shared that she is seeking counseling at this time in order to work on herself and be more positive. She reported that she was happy that the doctor had been able to prescribe her medication that gave great relief. She reported that she has good relationships with her children and her husband, who she described as very supportive. Mackenzie Ford shared that she had a relatively good childhood, though she experienced the death of her brother when he was only 5623 and she was a teenager. She shared that he died of AIDS and that she took care of him through his last months of life, which was extremely difficult. She reported that she does not have a close relationship with her mother at this time, due to her mother's manipulative and dramatic behaviors. Mackenzie Ford shared that she used to be an angry person but has been working on controlling her anger. She shared that she often feels sad, but probably not to the point of being depressed. She shared about her difficulty losing weight or adapting to a healthier lifestyle.  Suicidal/Homicidal: Nowithout intent/plan  Therapist Response: LCSW began the clinical assessment but was unable to finish due to time constraints. LCSW utilized supportive counseling techniques throughout the session in order to validate emotions and encourage open expression of emotion. LCSW assessed Mackenzie Ford's current family dynamics, family of origin issues, and presenting problems.   Plan: Return again in 2 weeks.  Diagnosis: Axis I: See current hospital problem list    Axis II: No diagnosis    Nilda Simmeratosha Antoinette Haskett,  LCSW 07/03/2016

## 2016-07-05 NOTE — Progress Notes (Signed)
Patient was given lab results over the phone

## 2016-07-05 NOTE — Progress Notes (Signed)
Patient was given lab results over the phone and was notified about buying fish oil capsules 1000mg  2 capsules  twice a day. Patient says everything is better with itching and burning after taking Fluconazole.

## 2016-07-11 ENCOUNTER — Telehealth: Payer: Self-pay | Admitting: Internal Medicine

## 2016-07-11 NOTE — Telephone Encounter (Signed)
Patient's husband Edison SimonWinston Gonzalez came in today stating patient has noticed a significant decreased on her vision and wants to know if patient has to wait until her follow up appointment on 07/31/16 or can she bee seen sooner for this problem. Please advise  Reviwed patient labs and contacted her with results . Patient states Isamel called with results and instructions about the fish oil but patient has not been able to get it yet. Burning and itching problem also resolved with Floconazole

## 2016-07-12 NOTE — Telephone Encounter (Signed)
To Dr. Mulberry  

## 2016-07-12 NOTE — Telephone Encounter (Signed)
Blurry vision, not able to read, needs to be really close to a person to be able to see them clearly , if she is not close to them is very blurry  Symptoms started about a month ago

## 2016-07-12 NOTE — Telephone Encounter (Signed)
Please call and get additional information on vision issues.

## 2016-07-12 NOTE — Telephone Encounter (Signed)
Please make her an appt. Sometime next week.

## 2016-07-16 ENCOUNTER — Other Ambulatory Visit: Payer: Self-pay | Admitting: Licensed Clinical Social Worker

## 2016-07-17 NOTE — Telephone Encounter (Signed)
Patient scheduled for 07/25/16 at 9:30 AM

## 2016-07-23 ENCOUNTER — Other Ambulatory Visit: Payer: Self-pay | Admitting: Licensed Clinical Social Worker

## 2016-07-25 ENCOUNTER — Encounter: Payer: Self-pay | Admitting: Internal Medicine

## 2016-07-25 ENCOUNTER — Ambulatory Visit (INDEPENDENT_AMBULATORY_CARE_PROVIDER_SITE_OTHER): Payer: Self-pay | Admitting: Internal Medicine

## 2016-07-25 VITALS — BP 124/70 | HR 90 | Resp 14 | Ht 59.0 in | Wt 266.0 lb

## 2016-07-25 DIAGNOSIS — R748 Abnormal levels of other serum enzymes: Secondary | ICD-10-CM

## 2016-07-25 DIAGNOSIS — E785 Hyperlipidemia, unspecified: Secondary | ICD-10-CM | POA: Insufficient documentation

## 2016-07-25 DIAGNOSIS — E1165 Type 2 diabetes mellitus with hyperglycemia: Secondary | ICD-10-CM

## 2016-07-25 DIAGNOSIS — E782 Mixed hyperlipidemia: Secondary | ICD-10-CM

## 2016-07-25 LAB — GLUCOSE, POCT (MANUAL RESULT ENTRY): POC Glucose: 173 mg/dl — AB (ref 70–99)

## 2016-07-25 NOTE — Progress Notes (Signed)
   Subjective:    Patient ID: Mackenzie Ford, female    DOB: 28-Sep-1975, 41 y.o.   MRN: [id number]   HPI   1.  Yeast Infection:  Took all 6 days of Fluconazole, but started feeling much better on day 3.    2.  DM:  Forgot journal.  In the morning, ranging between 124 and 134.  In the evening, 150-160 range in past 2 weeks.  She states has not been into 200s in past 2 weeks.    Husband also a patient here.  He is not changing his diet as we have discussed.  They have 3 children at home.  Discussed lifestyle changes should happen with the whole family.  Current Meds  Medication Sig  . Blood Glucose Monitoring Suppl (AGAMATRIX PRESTO) w/Device KIT Check sugars twice daily before meals  . glipiZIDE (GLUCOTROL) 5 MG tablet Take 1 tablet (5 mg total) by mouth 2 (two) times daily before a meal.  . glucose blood (AGAMATRIX PRESTO TEST) test strip Check sugars twice daily before meals  . metFORMIN (GLUCOPHAGE) 500 MG tablet Take 1 tablet (500 mg total) by mouth 2 (two) times daily with a meal.  . Omega-3 Fatty Acids (FISH OIL) 1200 MG CAPS Take by mouth daily.    No Known Allergies    Review of Systems     Objective:   Physical Exam Morbidly obese HEENT:  PERRL, EOMI,  Neck:  Supple, no adenopathy Chest:  CTA CV:  RRR without murmur or rub        Assessment & Plan:  1.  Yeast Vaginitis:  Resolved  2.  DM:  Reportedly much improved.  CPM.  Went over labs and goal for A1C less than 7.0% To add gradual increase in physical activity. Eye referral  3.  Hyperlipidemia:  Discussed increasing good fats and lifestyle changes, weight loss.  4.  Elevated Liver enzymes:  Likely Fatty liver.  As above.  Hour visit face to face with patient.

## 2016-07-31 ENCOUNTER — Ambulatory Visit: Payer: Self-pay | Admitting: Internal Medicine

## 2016-08-05 ENCOUNTER — Other Ambulatory Visit: Payer: Self-pay | Admitting: Licensed Clinical Social Worker

## 2016-08-06 ENCOUNTER — Other Ambulatory Visit: Payer: Self-pay | Admitting: Licensed Clinical Social Worker

## 2016-08-13 ENCOUNTER — Other Ambulatory Visit: Payer: Self-pay | Admitting: Licensed Clinical Social Worker

## 2016-08-20 ENCOUNTER — Ambulatory Visit (INDEPENDENT_AMBULATORY_CARE_PROVIDER_SITE_OTHER): Payer: Self-pay | Admitting: Licensed Clinical Social Worker

## 2016-08-20 DIAGNOSIS — F439 Reaction to severe stress, unspecified: Secondary | ICD-10-CM

## 2016-08-21 NOTE — Progress Notes (Signed)
   THERAPY PROGRESS NOTE  Session Time:  Participation Level: None  Behavioral Response: CasualAlertEuthymic  Type of Therapy: Individual Therapy  Treatment Goals addressed: Coping  Interventions: Motivational Interviewing, Strength-based and Supportive  Summary: Mackenzie Ford is a 41 y.o. female who presents with a positive mood and appropriate affect. She reported that she was feeling good about things in general and did not feel that she needed counseling. She denied any depression symptoms. Peg shared that she was feeling proud of herself for the lifestyle changes that she has made so far: cutting out soda, reducing the number of tortillas she eats per day, and eating less sweets. She reported that her motivation to continue with these changes was at an 8 or 9 out of 10. She shared that she can already tell a difference in the amount of energy she has daily. She stated that she wants her whole family to get healthy together. [name] shared about the experience she went through when gaining weight, as she used to be "skinny as a stick." She expressed frustration that her husband seemed to push her towards gaining weight, but acknowledged her own responsibility as well. She reported that she will continue to make positive changes for her health and will contact LCSW in the future if needed.  Suicidal/Homicidal: Nowithout intent/plan  Therapist Response: LCSW utilized supportive counseling techniques throughout the session in order to validate emotions and encourage open expression of emotion. LCSW used scaling questions to assess motivation for continued lifestyle changes. LCSW provided affirmations for changes that [name] has already made. LCSW encouraged her to return to counseling in the future if needed.   Plan: Return again in 0 weeks.  Diagnosis: Axis I: See current hospital problem list    Axis II: No diagnosis    Nilda Simmer, LCSW 08/21/2016

## 2016-08-30 ENCOUNTER — Telehealth: Payer: Self-pay | Admitting: Internal Medicine

## 2016-08-30 NOTE — Telephone Encounter (Signed)
To Dr. Mulberry for further direction 

## 2016-08-30 NOTE — Telephone Encounter (Signed)
Patient's husband called stating his wife's hair has been falling out for 3 weeks now and she thinks is caused by taking Metformin. Please advise

## 2016-09-06 NOTE — Telephone Encounter (Signed)
Will need to see her back--have not had a patient with hair loss due to Metformin in the past.  Would encourage to continue until can see her back

## 2016-09-11 NOTE — Telephone Encounter (Signed)
Patient informed and wants to know if she can wait until next follow up appointment on 10/22/16 8:30 AM or does she need to come in before

## 2016-09-12 NOTE — Telephone Encounter (Signed)
That's fine, if she is okay with waiting.

## 2016-09-13 NOTE — Telephone Encounter (Signed)
To Estefania to notify patient 

## 2016-09-20 NOTE — Telephone Encounter (Signed)
Patient states she is okay with waiting until follow up.

## 2016-10-16 ENCOUNTER — Other Ambulatory Visit (INDEPENDENT_AMBULATORY_CARE_PROVIDER_SITE_OTHER): Payer: Self-pay

## 2016-10-16 DIAGNOSIS — E1165 Type 2 diabetes mellitus with hyperglycemia: Secondary | ICD-10-CM

## 2016-10-16 DIAGNOSIS — E782 Mixed hyperlipidemia: Secondary | ICD-10-CM

## 2016-10-16 DIAGNOSIS — R748 Abnormal levels of other serum enzymes: Secondary | ICD-10-CM

## 2016-10-17 LAB — LIPID PANEL W/O CHOL/HDL RATIO
Cholesterol, Total: 202 mg/dL — ABNORMAL HIGH (ref 100–199)
HDL: 44 mg/dL (ref >39)
LDL CALC: 118 mg/dL — AB (ref 0–99)
Triglycerides: 199 mg/dL — ABNORMAL HIGH (ref 0–149)
VLDL CHOLESTEROL CAL: 40 mg/dL (ref 5–40)

## 2016-10-17 LAB — HEPATIC FUNCTION PANEL
ALT: 64 IU/L — AB (ref 0–32)
AST: 66 IU/L — ABNORMAL HIGH (ref 0–40)
Albumin: 3.9 g/dL (ref 3.5–5.5)
Alkaline Phosphatase: 96 IU/L (ref 39–117)
BILIRUBIN TOTAL: 0.3 mg/dL (ref 0.0–1.2)
Bilirubin, Direct: 0.1 mg/dL (ref 0.00–0.40)
Total Protein: 7.2 g/dL (ref 6.0–8.5)

## 2016-10-17 LAB — HGB A1C W/O EAG: Hgb A1c MFr Bld: 6.6 % — ABNORMAL HIGH (ref 4.8–5.6)

## 2016-10-22 ENCOUNTER — Ambulatory Visit (INDEPENDENT_AMBULATORY_CARE_PROVIDER_SITE_OTHER): Payer: Self-pay | Admitting: Internal Medicine

## 2016-10-22 ENCOUNTER — Encounter: Payer: Self-pay | Admitting: Internal Medicine

## 2016-10-22 VITALS — BP 112/82 | HR 88 | Resp 12 | Ht 59.5 in | Wt 255.0 lb

## 2016-10-22 DIAGNOSIS — E1165 Type 2 diabetes mellitus with hyperglycemia: Secondary | ICD-10-CM

## 2016-10-22 DIAGNOSIS — Z23 Encounter for immunization: Secondary | ICD-10-CM

## 2016-10-22 DIAGNOSIS — K029 Dental caries, unspecified: Secondary | ICD-10-CM

## 2016-10-22 DIAGNOSIS — E782 Mixed hyperlipidemia: Secondary | ICD-10-CM

## 2016-10-22 DIAGNOSIS — R748 Abnormal levels of other serum enzymes: Secondary | ICD-10-CM

## 2016-10-22 LAB — GLUCOSE, POCT (MANUAL RESULT ENTRY): POC Glucose: 138 mg/dl — AB (ref 70–99)

## 2016-10-22 NOTE — Patient Instructions (Signed)
Terbinafine Cream 1% twice daily to feet for 14 days. Then start Tea Tree oil mixed in [place] Foot cream all over feet, though not between toes at bedtime--work into split at back of heel. Check feet nightly

## 2016-10-22 NOTE — Progress Notes (Signed)
Subjective:    Patient ID: Mackenzie Ford, female    DOB: 1976/05/19, 41 y.o.   MRN: [id number]  HPI   1.  DM:  Discussed how well she had done with bringing her weight down 16 lbs and her A1C to 6.6% from 12.8% over 3 month period.   She is working on diet and regular physical activity regularly. Stopped soda completely, though her husband, also with diabetes, is still drinking soda.   Not eating vegetables every day. Taking Metformin and Glipizide twice daily with meals. Has not had eye referral yet. Has not had Pneumococcal vaccine No Tdap that she is aware of.    2.  Hyperlipidemia:  Taking fish oil caps twice daily and changes with lifestyle as above.  Discussed importance of entire family working on lifestyle changes together. Cholesterol panel impressively improved as well with lifestyle changes.     3.  Elevated liver enzymes:  Somewhat improved with weight loss and metabolic changes.    4.  Health maintenance:  Last pap, which has always been normal was in 2017.  Had performed at another clinic, cannot recall where.  Current Meds  Medication Sig  . Blood Glucose Monitoring Suppl (AGAMATRIX PRESTO) w/Device KIT Check sugars twice daily before meals  . glipiZIDE (GLUCOTROL) 5 MG tablet Take 1 tablet (5 mg total) by mouth 2 (two) times daily before a meal.  . glucose blood (AGAMATRIX PRESTO TEST) test strip Check sugars twice daily before meals  . metFORMIN (GLUCOPHAGE) 500 MG tablet Take 1 tablet (500 mg total) by mouth 2 (two) times daily with a meal.  . Omega-3 Fatty Acids (FISH OIL) 1200 MG CAPS Take by mouth daily.    No Known Allergies  Review of Systems     Objective:   Physical Exam   NAD Very happy with her results HEENT:  Loss of teeth with caries Lungs:  CTA CV:  RRR without murmur or rub, radial pulses normal and equal.   Abd:  Morbidly obese. Legs:  No edema  Diabetic Foot Exam - Simple   Simple Foot Form Diabetic Foot exam was performed  with the following findings:  Yes 10/22/2016  9:38 AM  Visual Inspection See comments:  Yes Sensation Testing Intact to touch and monofilament testing bilaterally:  Yes Pulse Check Posterior Tibialis and Dorsalis pulse intact bilaterally:  Yes Comments Thick callous at edges of feet with splitting of callous at back of right heel.  No erythema.  Significant flaking of feet, thickened deformed and discolored nails.        Assessment & Plan:  1.  DM:  Huge improvement with glucose control due to lifestyle changes and medication.  Congratulated on her hard work to get her so quickly Pneumovax 23 v today. Referral for diabetic eye exam  2.  Hyperlipidemia:  Significant improvement as well with Triglycerides and HDL in particular.  As doing so well with lifestyle changes, no statin at this point.  Would like to see if she can get this to goal with what she is currently doing.  3.  Elevated liver enzymes:  Mild improvement with metabolic changes.  Likely fatty liver.  4.  Microalbuminuria:  Recheck in 3 months to see if needs ACE I.    5.  Morbid obesity:  Congratulated on her 16 lb weight loss.6.  Health Maintenance:  Tdap today.  Release of info to get pap results from last year  6.  Health Maintenance:  Tdap today.  Release  of info to get pap results from last year

## 2017-01-21 ENCOUNTER — Other Ambulatory Visit (INDEPENDENT_AMBULATORY_CARE_PROVIDER_SITE_OTHER): Payer: Self-pay | Admitting: Internal Medicine

## 2017-01-21 DIAGNOSIS — E782 Mixed hyperlipidemia: Secondary | ICD-10-CM

## 2017-01-21 DIAGNOSIS — E1165 Type 2 diabetes mellitus with hyperglycemia: Secondary | ICD-10-CM

## 2017-01-21 DIAGNOSIS — R748 Abnormal levels of other serum enzymes: Secondary | ICD-10-CM

## 2017-01-22 LAB — COMPREHENSIVE METABOLIC PANEL
ALBUMIN: 3.8 g/dL (ref 3.5–5.5)
ALT: 46 IU/L — ABNORMAL HIGH (ref 0–32)
AST: 43 IU/L — AB (ref 0–40)
Albumin/Globulin Ratio: 1.2 (ref 1.2–2.2)
Alkaline Phosphatase: 94 IU/L (ref 39–117)
BUN / CREAT RATIO: 17 (ref 9–23)
BUN: 10 mg/dL (ref 6–24)
Bilirubin Total: 0.3 mg/dL (ref 0.0–1.2)
CALCIUM: 9 mg/dL (ref 8.7–10.2)
CO2: 24 mmol/L (ref 20–29)
Chloride: 103 mmol/L (ref 96–106)
Creatinine, Ser: 0.6 mg/dL (ref 0.57–1.00)
GFR calc Af Amer: 129 mL/min/{1.73_m2} (ref >59)
GFR, EST NON AFRICAN AMERICAN: 112 mL/min/{1.73_m2} (ref >59)
GLOBULIN, TOTAL: 3.1 g/dL (ref 1.5–4.5)
GLUCOSE: 112 mg/dL — AB (ref 65–99)
Potassium: 4.4 mmol/L (ref 3.5–5.2)
SODIUM: 143 mmol/L (ref 134–144)
Total Protein: 6.9 g/dL (ref 6.0–8.5)

## 2017-01-22 LAB — LIPID PANEL W/O CHOL/HDL RATIO
Cholesterol, Total: 207 mg/dL — ABNORMAL HIGH (ref 100–199)
HDL: 42 mg/dL (ref >39)
LDL Calculated: 125 mg/dL — ABNORMAL HIGH (ref 0–99)
Triglycerides: 198 mg/dL — ABNORMAL HIGH (ref 0–149)
VLDL Cholesterol Cal: 40 mg/dL (ref 5–40)

## 2017-01-22 LAB — HGB A1C W/O EAG: Hgb A1c MFr Bld: 6.4 % — ABNORMAL HIGH (ref 4.8–5.6)

## 2017-01-23 ENCOUNTER — Ambulatory Visit: Payer: Self-pay | Admitting: Internal Medicine

## 2017-02-10 ENCOUNTER — Encounter: Payer: Self-pay | Admitting: Internal Medicine

## 2017-02-10 ENCOUNTER — Ambulatory Visit (INDEPENDENT_AMBULATORY_CARE_PROVIDER_SITE_OTHER): Payer: Self-pay | Admitting: Internal Medicine

## 2017-02-10 VITALS — BP 118/78 | HR 70 | Resp 12 | Ht 59.5 in | Wt 266.0 lb

## 2017-02-10 DIAGNOSIS — E782 Mixed hyperlipidemia: Secondary | ICD-10-CM

## 2017-02-10 DIAGNOSIS — R809 Proteinuria, unspecified: Secondary | ICD-10-CM

## 2017-02-10 DIAGNOSIS — E1165 Type 2 diabetes mellitus with hyperglycemia: Secondary | ICD-10-CM

## 2017-02-10 DIAGNOSIS — N898 Other specified noninflammatory disorders of vagina: Secondary | ICD-10-CM

## 2017-02-10 DIAGNOSIS — G8929 Other chronic pain: Secondary | ICD-10-CM

## 2017-02-10 DIAGNOSIS — R748 Abnormal levels of other serum enzymes: Secondary | ICD-10-CM

## 2017-02-10 DIAGNOSIS — M545 Low back pain: Secondary | ICD-10-CM

## 2017-02-10 HISTORY — DX: Proteinuria, unspecified: R80.9

## 2017-02-10 LAB — POCT WET PREP WITH KOH
KOH Prep POC: NEGATIVE
RBC Wet Prep HPF POC: NEGATIVE
TRICHOMONAS UA: NEGATIVE
Yeast Wet Prep HPF POC: NEGATIVE

## 2017-02-10 LAB — GLUCOSE, POCT (MANUAL RESULT ENTRY): POC GLUCOSE: 113 mg/dL — AB (ref 70–99)

## 2017-02-10 MED ORDER — METRONIDAZOLE 500 MG PO TABS: ORAL_TABLET | ORAL | 0 refills | Status: DC

## 2017-02-10 NOTE — Progress Notes (Signed)
Subjective:    Patient ID: Mackenzie Ford, female    DOB: 07/17/1975, 41 y.o.   MRN: [id number]  HPI   1.  Morbid obesity:  Breakfast around 8 a.m.:  Often eats bread or fried eggs or milk and a banana.   Only eats once or twice daily. Does not sit down with kids to eat in the evening, though husband does. Has gained back all of the 11 lbs she previously lost.   She thinks it is because she is eating more.   Walks 30 minutes on treadmill daily. Tried to walk with her husband, but he talks too much, so stopped. They are willing to have her walk on treadmill when he leaves for work and when he returns to walk another 30 minutes together outside. Up to date on Pneumococcal and Tdap vaccines Needs flu vaccine Does not check feet nightly.  2.  DM:  Recent A1C down to 6.4% from 6.6% in the spring.  Not checking sugars every day.  We have been working on her husband to take care of his DM and discussed with her setting the example for care.    Does miss meds some times.   3.  Elevated Liver Enzymes:  Decreased, likely due to fatty liver.  Not normal yet.  Alcohol intake is rare.  No history of hepatitis or illness where she was jaundiced when younger.  4.  Hyperlipidemia:  Discussed cholesterol going wrong direction. Not consistent with fish oil intake. Lipid Panel     Component Value Date/Time   CHOL 207 (H) 01/21/2017 1027   TRIG 198 (H) 01/21/2017 1027   HDL 42 01/21/2017 1027   LDLCALC 125 (H) 01/21/2017 1027   5.  Microalbuminuria:  For some reason, did not check with current labs and sugars under better control.  BP is controlled without meds.  6.  Left low back pain:  Has had for years.  Seen by chiropractor in [place], but no consistent or long term relief.  No xrays in past.  No radicular symptoms.  No weakness or numbness in leg.  Current Meds  Medication Sig  . Blood Glucose Monitoring Suppl (AGAMATRIX PRESTO) w/Device KIT Check sugars twice daily before meals  .  glipiZIDE (GLUCOTROL) 5 MG tablet Take 1 tablet (5 mg total) by mouth 2 (two) times daily before a meal.  . glucose blood (AGAMATRIX PRESTO TEST) test strip Check sugars twice daily before meals  . metFORMIN (GLUCOPHAGE) 500 MG tablet Take 1 tablet (500 mg total) by mouth 2 (two) times daily with a meal.  . Omega-3 Fatty Acids (FISH OIL) 1200 MG CAPS Take by mouth daily.    No Known Allergies   Review of Systems     Objective:   Physical Exam   Morbidly obese Lungs:  CTA CV:  RRR without murmur or rub, radial and DP pulses normal and equal Abd:  S, NT, No HSM or mass, but limited by size, + BS LE:  No edema--wearing tennis shoes, but walking on back of shoes--feet not all the way in shoes. BacK:  NT over spinous processes.  Minimal tenderness over left paraspinous musculature. Neuro:  LE:  Motor 5/5 and DTRs 2+/4 throughout, gait normal        Assessment & Plan:  1.  Morbid Obesity:  Both patient and husband (and children)  All need to work on lifestyle changes together.  Discussed getting day scheduled for meals and physical activity. To do her walking inside  for 30 minutes and then when husband returns home, to gradually increase walking with him daily for another 30 minutes.  2. DM improved control with Glipizide and Metformin, but does miss all meds relatively frequently.  Discussed her lifestyle changes are as important for control Recheck microalbumin/crea--if still elevated with improved DM control, add Lisinopril Flu vaccine [facility]  3.  Hyperlipidemia:  As above.  Would like more time to work on lifestyle changes before adding statin.  4.  Left low back pain:  [place] [facility], weight loss  5.  Vaginal discharge:  BV:  Metronidazole 500 mg twice daily for 7 days

## 2017-02-10 NOTE — Patient Instructions (Signed)
Tome un vaso de agua antes de cada comida Tome un minimo de 6 a 8 vasos de agua diarios Coma tres veces al dia Coma una proteina y una grasa saludable con comida.  (huevos, pescado, pollo, pavo, y limite carnes rojas Coma 5 porciones diarias de legumbres.  Mezcle los colores Coma 2 porciones diarias de frutas con cascara cuando sea comestible Use platos pequeos Suelte su tenedor o cuchara despues de cada mordida hata que se mastique y se trague Come en la mesa con amigos o familiares por lo menos una vez al dia Apague la televisin y aparatos electrnicos durante la comida  Su objetivo debe ser perder una libra por semana  Free flu vaccine at flu vaccine clinics:  September 20 8:30 to 11:30 a.m. And [facility]th [facility] 1[facility]4 p.m.  Both at [facility] next door to clinic  

## 2017-02-10 NOTE — Progress Notes (Signed)
Referral and demographics and OV notes faxed to [facility]. They will contact patient to set up appointment.

## 2017-02-11 LAB — MICROALBUMIN / CREATININE URINE RATIO
Creatinine, Urine: 172.6 mg/dL
Microalb/Creat Ratio: 17.7 mg/g creat (ref 0.0–30.0)
Microalbumin, Urine: 30.5 ug/mL

## 2017-03-12 ENCOUNTER — Ambulatory Visit: Payer: Self-pay | Admitting: Internal Medicine

## 2017-04-08 ENCOUNTER — Other Ambulatory Visit (INDEPENDENT_AMBULATORY_CARE_PROVIDER_SITE_OTHER): Payer: Self-pay | Admitting: Internal Medicine

## 2017-04-08 DIAGNOSIS — Z23 Encounter for immunization: Secondary | ICD-10-CM

## 2017-04-14 ENCOUNTER — Encounter: Payer: Self-pay | Admitting: Internal Medicine

## 2017-04-14 ENCOUNTER — Ambulatory Visit: Payer: Self-pay | Admitting: Internal Medicine

## 2017-04-14 VITALS — BP 112/82 | HR 72 | Resp 12 | Ht 59.5 in | Wt 264.0 lb

## 2017-04-14 DIAGNOSIS — R748 Abnormal levels of other serum enzymes: Secondary | ICD-10-CM

## 2017-04-14 DIAGNOSIS — E1165 Type 2 diabetes mellitus with hyperglycemia: Secondary | ICD-10-CM

## 2017-04-14 DIAGNOSIS — E782 Mixed hyperlipidemia: Secondary | ICD-10-CM

## 2017-04-14 LAB — GLUCOSE, POCT (MANUAL RESULT ENTRY): POC Glucose: 115 mg/dl — AB (ref 70–99)

## 2017-04-14 NOTE — Progress Notes (Signed)
   Subjective:    Patient ID: Mackenzie Ford, female    DOB: 03/21/1976, 41 y.o.   MRN: [id number]  HPI   1.  DM: Only checking her sugars once weekly.  Not clear if her husband does not want her losing a lot of weight.   Has been using elliptical trainer for 30 minutes daily. She eats sometimes around 1 a.m when her husband gets up to go to his job of milking cows. She eats at 9 a.m. And 4 p.m., but does not sit down with her son at 7 p.m.   2.  Hyperlipidemia:  Fasting this morning.  3.  Elevated liver enzymes:  Likely related to fatty liver.    4.  Left low back pain:  Better.  States has been going to [facility] with good results.  5.  Morbid obesity:  Discussed diet and physical activity.  She is eating only 2 times daily  6.  Brings up wanting to get pregnant and whether this would be possible.  Discussed unlikely with her age and weight and would not really recommend pregnancy with her health issues at this point.   She states she may have had tubes tied. Ultimately, looking through [facility], found she underwent tubal ligation with her last pregnancy in February of 2009.  She was tearful with this finding.    Current Meds  Medication Sig  . Blood Glucose Monitoring Suppl (AGAMATRIX PRESTO) w/Device KIT Check sugars twice daily before meals  . glipiZIDE (GLUCOTROL) 5 MG tablet Take 1 tablet (5 mg total) by mouth 2 (two) times daily before a meal.  . glucose blood (AGAMATRIX PRESTO TEST) test strip Check sugars twice daily before meals  . metFORMIN (GLUCOPHAGE) 500 MG tablet Take 1 tablet (500 mg total) by mouth 2 (two) times daily with a meal.  . Omega-3 Fatty Acids (FISH OIL) 1200 MG CAPS Take by mouth daily.   No Known Allergies  Review of Systems     Objective:   Physical Exam NAD HEENT:  PERRL, EOMI, discs sharp, TMs pearly gray, throat without injection Neck:  Supple, No adenopathy, no thyromegaly Chest:  CTA CV:  RRR with normal S1 and S2,  No S3, S4 or murmur.  Radial and DP pulses normal and equal. LE:  No edema         Assessment & Plan:  1.  DM:  At goal in May with A1C of 6.6%, enormous improvement from January when above 12 and newly diagnosed. Discussed combination of poorly controlled DM and obesity increase her risk for recurrent vaginal yeast infections, which she has had. Very long discussion with patient and her husband for the need to work on lifestyle changes as a family as both with  DM and to prevent development of DM in children. A1C today.  2.  Hyperlipidemia:  FLP today.    3.  Elevated liver enzymes:  Likely fatty liver.  CMP today.  4.  Morbid Obesity:  As in #1  5.  Low Back Pain:  Continuing with [facility] and improving.  6.  Wish for pregnancy:  Again, discussed she had what is considered a permanent option to prevent pregnancy, plus age make this highly unlikely even if reversed.  Not clear why she is interested in this today. Offered counseling. 

## 2017-04-15 LAB — LIPID PANEL W/O CHOL/HDL RATIO
Cholesterol, Total: 195 mg/dL (ref 100–199)
HDL: 44 mg/dL (ref >39)
LDL Calculated: 111 mg/dL — ABNORMAL HIGH (ref 0–99)
TRIGLYCERIDES: 202 mg/dL — AB (ref 0–149)
VLDL CHOLESTEROL CAL: 40 mg/dL (ref 5–40)

## 2017-04-15 LAB — HEPATIC FUNCTION PANEL
ALT: 24 IU/L (ref 0–32)
AST: 24 IU/L (ref 0–40)
Albumin: 3.9 g/dL (ref 3.5–5.5)
Alkaline Phosphatase: 90 IU/L (ref 39–117)
Bilirubin Total: 0.2 mg/dL (ref 0.0–1.2)
Bilirubin, Direct: 0.06 mg/dL (ref 0.00–0.40)
Total Protein: 7.2 g/dL (ref 6.0–8.5)

## 2017-04-18 ENCOUNTER — Telehealth: Payer: Self-pay | Admitting: Licensed Clinical Social Worker

## 2017-04-18 NOTE — Telephone Encounter (Signed)
LCSW called pt to check in and try to schedule counseling session based on Dr Mulberry's concerns. Pt shared that she was "only a little bit" stressed and didn't think that she needed counseling or support at this time. She stated that she would consider counseling and call LCSW back.

## 2017-04-29 ENCOUNTER — Encounter: Payer: Self-pay | Admitting: Internal Medicine

## 2017-06-01 ENCOUNTER — Encounter: Payer: Self-pay | Admitting: Internal Medicine

## 2017-07-11 ENCOUNTER — Telehealth: Payer: Self-pay | Admitting: Internal Medicine

## 2017-07-11 ENCOUNTER — Other Ambulatory Visit: Payer: Self-pay

## 2017-07-11 MED ORDER — GLIPIZIDE 5 MG PO TABS: 5.0000 mg | ORAL_TABLET | Freq: Two times a day (BID) | ORAL | 11 refills | Status: DC

## 2017-07-11 MED ORDER — METFORMIN HCL 500 MG PO TABS: 500.0000 mg | ORAL_TABLET | Freq: Two times a day (BID) | ORAL | 11 refills | Status: DC

## 2017-07-11 NOTE — Telephone Encounter (Signed)
Patient needs a Rx on Metformin and Glipizide. Please advise.

## 2017-07-11 NOTE — Telephone Encounter (Signed)
Rx's sent to pharmacy.  

## 2017-07-16 ENCOUNTER — Ambulatory Visit: Payer: Self-pay | Admitting: Internal Medicine

## 2017-09-25 ENCOUNTER — Encounter: Payer: Self-pay | Admitting: Internal Medicine

## 2017-10-28 ENCOUNTER — Encounter: Payer: Self-pay | Admitting: Internal Medicine

## 2017-10-28 ENCOUNTER — Telehealth: Payer: Self-pay | Admitting: Internal Medicine

## 2017-10-28 NOTE — Telephone Encounter (Signed)
Having diarrhea --last was yesterday and vomiting--last was about 4:40 pm today.  Is keeping water down May have had some blood in one stool yesterday--not a lot.  Called patient back after 5 p.m. And left message to call clinic back so I could get more information.  Would like to give advice for the evening and set her up possibly for appt tomorrow around 12:30 p.m.

## 2017-10-29 ENCOUNTER — Encounter: Payer: Self-pay | Admitting: Internal Medicine

## 2017-10-29 ENCOUNTER — Ambulatory Visit: Payer: Self-pay | Admitting: Internal Medicine

## 2017-10-29 VITALS — BP 126/82 | HR 72 | Temp 98.0°F | Resp 12 | Ht 59.5 in | Wt 262.0 lb

## 2017-10-29 DIAGNOSIS — B349 Viral infection, unspecified: Secondary | ICD-10-CM

## 2017-10-29 DIAGNOSIS — E1165 Type 2 diabetes mellitus with hyperglycemia: Secondary | ICD-10-CM

## 2017-10-29 LAB — POCT URINALYSIS DIPSTICK
BILIRUBIN UA: NEGATIVE
Blood, UA: NEGATIVE
Glucose, UA: NEGATIVE
KETONES UA: NEGATIVE
Leukocytes, UA: NEGATIVE
Nitrite, UA: NEGATIVE
PH UA: 5 (ref 5.0–8.0)
PROTEIN UA: NEGATIVE
Spec Grav, UA: 1.02 (ref 1.010–1.025)
UROBILINOGEN UA: 0.2 U/dL

## 2017-10-29 LAB — GLUCOSE, POCT (MANUAL RESULT ENTRY): POC GLUCOSE: 125 mg/dL — AB (ref 70–99)

## 2017-10-29 MED ORDER — ONDANSETRON HCL 4 MG PO TABS: 4.0000 mg | ORAL_TABLET | Freq: Three times a day (TID) | ORAL | 0 refills | Status: DC | PRN

## 2017-10-29 NOTE — Patient Instructions (Signed)
Go home and take a dose of Zofran.  Wait for about 30 minutes, then begin sipping water or diluted Gatorade in small amounts frequently.  May take the Zofran every 8 hours as needed.   If you have been able to keep down clear liquids for 24 hours, may advance to bland foods and gradually advance diet, but no heavy, greasy foods. Call if unable to keep liquids down over next 24 hours.

## 2017-10-29 NOTE — Progress Notes (Signed)
   Subjective:    Patient ID: Mackenzie Ford, female    DOB: 23-Dec-1975, 42 y.o.   MRN: [id number]  HPI   Discussed her 3 cancelled appts since February, including the one yesterday and the concern for adequate follow up.  Started with feeling ill about 4 days ago.  Started with headache, nasal congestion, sore throat and drainage down her throat, cough productive of clear to white sputum.   She also felt she was having fevers, but never took her temp.  Her son and husband state her skin never felt hot when she complained of fever, however. 3 days ago, developed nausea, vomiting and diarrhea.   Called yesterday about this.   She states she just vomited a small amount here in the clinic bathroom--clear liquid.  Prior, had not vomited since yesterday afternoon. She has only been drinking water in sips since last afternoon. Had 2 episodes diarrhea yesterday and one this morning.  She had small amount of bleeding with a stool day before yesterday and then also today.  Some blood is in the toilet and some on tissue with wiping.   Has only taken Tylenol.  No cold remedies or remedies for nausea or vomiting.   Tylenol has not helped with the headache much. Entire family was ill with respiratory illness 2 weeks ago--patient was last to start up with symptoms.   She started work the day her symptoms really worsened 4 days ago. Later, states she has had burning on urination since yesterday.  2.  DM:  Sugars have been fine running in low 100s.    Current Meds  Medication Sig  . Blood Glucose Monitoring Suppl (AGAMATRIX PRESTO) w/Device KIT Check sugars twice daily before meals  . glipiZIDE (GLUCOTROL) 5 MG tablet Take 1 tablet (5 mg total) by mouth 2 (two) times daily before a meal.  . glucose blood (AGAMATRIX PRESTO TEST) test strip Check sugars twice daily before meals  . metFORMIN (GLUCOPHAGE) 500 MG tablet Take 1 tablet (500 mg total) by mouth 2 (two) times daily with a meal.  . Omega-3  Fatty Acids (FISH OIL) 1200 MG CAPS Take by mouth daily.   No Known Allergies    Review of Systems     Objective:   Physical Exam NAD HEENT:  PERRL, EOMI, TMs pearly gray, throat without injection of exudate.  MMM.  Tenderness mildly over frontal and maxillary sinuses Neck:  Supple, No adenopathy Chest:  CTA CV:  RRR without murmur or rub.  Radial and DP pulses normal and equal Abd:  S, Mild generalized tenderness.  No HSM or mass, + BS LE:  No edema.  Good cap refill.   UA:  normal    Assessment & Plan:  1.  Viral Syndrome/Gastroenteritis:  Zofran as needed.  Gradually advance clear liquids over next 24 hours and advance diet slowly from there. Call if unable to keep liquids down.  2.  DM:  Sugars okay now.  To call if problems with highs or lows.

## 2017-12-02 ENCOUNTER — Encounter: Payer: Self-pay | Admitting: Internal Medicine

## 2017-12-02 ENCOUNTER — Ambulatory Visit: Payer: Self-pay | Admitting: Internal Medicine

## 2017-12-02 VITALS — BP 130/80 | HR 80 | Resp 12 | Ht 59.5 in | Wt 266.0 lb

## 2017-12-02 DIAGNOSIS — Z6841 Body Mass Index (BMI) 40.0 and over, adult: Secondary | ICD-10-CM

## 2017-12-02 DIAGNOSIS — Z114 Encounter for screening for human immunodeficiency virus [HIV]: Secondary | ICD-10-CM

## 2017-12-02 DIAGNOSIS — Z79899 Other long term (current) drug therapy: Secondary | ICD-10-CM

## 2017-12-02 DIAGNOSIS — Z1231 Encounter for screening mammogram for malignant neoplasm of breast: Secondary | ICD-10-CM

## 2017-12-02 DIAGNOSIS — Z124 Encounter for screening for malignant neoplasm of cervix: Secondary | ICD-10-CM

## 2017-12-02 DIAGNOSIS — K0889 Other specified disorders of teeth and supporting structures: Secondary | ICD-10-CM

## 2017-12-02 DIAGNOSIS — Z1239 Encounter for other screening for malignant neoplasm of breast: Secondary | ICD-10-CM

## 2017-12-02 DIAGNOSIS — R809 Proteinuria, unspecified: Secondary | ICD-10-CM

## 2017-12-02 DIAGNOSIS — E1165 Type 2 diabetes mellitus with hyperglycemia: Secondary | ICD-10-CM

## 2017-12-02 DIAGNOSIS — E782 Mixed hyperlipidemia: Secondary | ICD-10-CM

## 2017-12-02 DIAGNOSIS — B351 Tinea unguium: Secondary | ICD-10-CM

## 2017-12-02 DIAGNOSIS — Z0000 Encounter for general adult medical examination without abnormal findings: Secondary | ICD-10-CM

## 2017-12-02 LAB — POCT WET PREP WITH KOH
CLUE CELLS WET PREP PER HPF POC: NEGATIVE
KOH Prep POC: NEGATIVE
RBC WET PREP PER HPF POC: NEGATIVE
TRICHOMONAS UA: NEGATIVE
Yeast Wet Prep HPF POC: NEGATIVE

## 2017-12-02 LAB — GLUCOSE, POCT (MANUAL RESULT ENTRY): POC GLUCOSE: 128 mg/dL — AB (ref 70–99)

## 2017-12-02 MED ORDER — TERBINAFINE HCL 250 MG PO TABS: ORAL_TABLET | ORAL | 0 refills | Status: DC

## 2017-12-02 NOTE — Progress Notes (Signed)
Subjective:    Patient ID: Mackenzie Ford, female    DOB: [birth year], 42 y.o.   MRN: [id number]  HPI   CPE with pap  1.  Pap:  Last pap probably 3 years ago.  Normal.  Always normal in past.  No family history of cervical cancer.    2.  Mammogram:  Has never had mammogram.  No family history of breast cancer.  3.  Osteoprevention:  Does not drink much in way of dairy. Uses treadmill and stationary bike for an hour 3 times weekly.  She can break this up into smaller time increments and fit into each day.   4.  Guaiac Cards:  Never.  5.  Colonoscopy:  Never.  No family history of colon cancer.  6.  Immunizations:   Immunization History  Administered Date(s) Administered  . Influenza Inj Mdck Quad Pf 04/08/2017  . Pneumococcal Polysaccharide-23 10/22/2016  . Tdap 10/22/2016    7.  Glucose/Cholesterol:  Known DM and hyperlipidemia.  Last A1C was excellent at 6.4% September of 2018.  Lipid Panel     Component Value Date/Time   CHOL 195 04/14/2017 1458   TRIG 202 (H) 04/14/2017 1458   HDL 44 04/14/2017 1458   LDLCALC 111 (H) 04/14/2017 1458   Current Meds  Medication Sig  . Blood Glucose Monitoring Suppl (AGAMATRIX PRESTO) w/Device KIT Check sugars twice daily before meals  . glipiZIDE (GLUCOTROL) 5 MG tablet Take 1 tablet (5 mg total) by mouth 2 (two) times daily before a meal.  . glucose blood (AGAMATRIX PRESTO TEST) test strip Check sugars twice daily before meals  . metFORMIN (GLUCOPHAGE) 500 MG tablet Take 1 tablet (500 mg total) by mouth 2 (two) times daily with a meal.  . Omega-3 Fatty Acids (FISH OIL) 1200 MG CAPS Take by mouth daily.    No Known Allergies   Past Medical History:  Diagnosis Date  . Hyperlipidemia 2017  . Microalbuminuria 02/10/2017  . Morbid obesity with body mass index (BMI) of 50.0 to 59.9 in adult [facility]) 2016  . Type 2 diabetes mellitus with hyperglycemia, without long-term current use of insulin ([place]) 05/21/2007   Past Surgical History:   Procedure Laterality Date  . CESAREAN SECTION    . CESAREAN SECTION    . CESAREAN SECTION    . TUBAL LIGATION  06/28/2007   Social History   Socioeconomic History  . Marital status: Married    Spouse name: [name]  . Number of children: 3  . Years of education: 9  . Highest education level: Not on file  Occupational History  . Occupation: Housewife  Social Needs  . Financial resource strain: Not hard at all  . Food insecurity:    Worry: Never true    Inability: Never true  . Transportation needs:    Medical: No    Non-medical: No  Tobacco Use  . Smoking status: Never Smoker  . Smokeless tobacco: Never Used  Substance and Sexual Activity  . Alcohol use: No    Alcohol/week: 0.0 oz  . Drug use: No  . Sexual activity: Yes    Partners: Male    Birth control/protection: Surgical  Lifestyle  . Physical activity:    Days per week: 3 days    Minutes per session: 60 min  . Stress: Not at all  Relationships  . Social connections:    Talks on phone: Never    Gets together: More than three times a week    Attends religious  service: Never    Active member of club or organization: No    Attends meetings of clubs or organizations: Never    Relationship status: Married  . Intimate partner violence:    Fear of current or ex partner: Not on file    Emotionally abused: Not on file    Physically abused: Not on file    Forced sexual activity: Not on file  Other Topics Concern  . Not on file  Social History Narrative   Came to [place]   Lives at home with husband who is patient here, Tyna Jaksch, and their 3 children.   Family History  Problem Relation Age of Onset  . Diabetes Mother   . Cancer Mother 43       Uterine  . HIV/AIDS Brother   . Asthma Son      Review of Systems  Constitutional: Negative for appetite change, fatigue, fever and unexpected weight change.  HENT: Positive for dental problem (Had swelling of lower jaw around a broken tooth). Negative for hearing  loss, sinus pain, sneezing and sore throat.   Eyes: Positive for visual disturbance (Received glasses in January-bifocals,  felt dizzy with them and sounds like looking through wrong part of lens when close vs far.).  Respiratory: Negative for cough, choking and shortness of breath.   Cardiovascular: Positive for palpitations (sometimes.  Lasts less than 1 minute). Negative for chest pain and leg swelling.  Gastrointestinal: Positive for blood in stool (sometimes on paper and in toilet as well.  Has history of hemorrhoids). Negative for abdominal pain, constipation and diarrhea.  Genitourinary: Negative for dysuria, frequency and menstrual problem (Monthly period).  Musculoskeletal: Negative for arthralgias.  Skin: Negative for rash.  Neurological: Negative for weakness and numbness.  Psychiatric/Behavioral: Negative for dysphoric mood. The patient is not nervous/anxious.        Objective:   Physical Exam  Constitutional: She is oriented to person, place, and time.  Morbidly obese  HENT:  Head: Normocephalic and atraumatic.  Right Ear: Hearing, tympanic membrane, external ear and ear canal normal.  Left Ear: Hearing, tympanic membrane, external ear and ear canal normal.  Nose: Nose normal.  Mouth/Throat: Oropharynx is clear and moist. Dental caries present.  Eyes: Pupils are equal, round, and reactive to light. Conjunctivae and EOM are normal.  Discs sharp bilaterally  Neck: Normal range of motion and full passive range of motion without pain. Neck supple. No thyroid mass and no thyromegaly present.  Cardiovascular: Normal rate, regular rhythm, S1 normal and S2 normal. Exam reveals no S3, no S4 and no friction rub.  No murmur heard. No carotid bruits.  Carotid, radial, femoral, DP and PT pulses normal and equal.   Pulmonary/Chest: Effort normal and breath sounds normal. Right breast exhibits no inverted nipple, no mass, no nipple discharge, no skin change and no tenderness. Left breast  exhibits no inverted nipple, no nipple discharge, no skin change and no tenderness.  Abdominal: Soft. Bowel sounds are normal. She exhibits no mass. There is no hepatosplenomegaly. There is no tenderness. No hernia.  Abdomen in morbidly obese.  In left lateral abdomen/flank area, palpable soft tissue mass under skin.  5 cm in diameter.  NT  Genitourinary: Rectum normal and vagina normal. Rectal exam shows no mass and guaiac negative stool.  Genitourinary Comments: Normal external genitalia. Difficulty finding cervix, even on bimanual exam.  No direct visualization of face of cervix.   Pap taken of area felt to be cervix No uterine or  adnexal mass or tenderness, though exam limited with size.  Musculoskeletal: Normal range of motion.  Lymphadenopathy:       Head (right side): No submental and no submandibular adenopathy present.       Head (left side): No submental and no submandibular adenopathy present.    She has no cervical adenopathy.    She has no axillary adenopathy.       Right: No inguinal and no supraclavicular adenopathy present.       Left: No inguinal and no supraclavicular adenopathy present.  Neurological: She is alert and oriented to person, place, and time. She has normal strength and normal reflexes. No cranial nerve deficit or sensory deficit. Coordination and gait normal.  Diabetic Foot Exam - Simple   Simple Foot Form Diabetic Foot exam was performed with the following findings:  Yes  12/02/2017  5:43 PM  Visual Inspection See comments:  Yes Sensation Testing Intact to touch and monofilament testing bilaterally:  Yes Pulse Check Posterior Tibialis and Dorsalis pulse intact bilaterally:  Yes Comments Feet with thick flaking involving entire plantar surfaces.  Great toenails  thickened, crumbling and discolored    Skin: Skin is warm and dry. Capillary refill takes less than 2 seconds. No rash noted.  Psychiatric: She has a normal mood and affect.      Wet  Prep/KOH:  WNL    Assessment & Plan:  1.  CPE with pap--not clear if endocervical cells obtained Mammogram ordered Fasting labs today:  FLP, CBC, CMP, A1C, urine microalbumin/crea, HIV  2.  DM/hyperlipidemia, morbid obesity, Microalbuminuria:  Fasting labs as above  3.  Dental pain and decay: dental referral.  4.  Onychomycosis of toenails and tinea pedis:  CMP.  Terbinafine 250 mg daily for 84 days. Follow up in 6 and 12 weeks.

## 2017-12-02 NOTE — Patient Instructions (Signed)
Call clinic in September to find out our influenza vaccine clinics

## 2017-12-03 LAB — CBC WITH DIFFERENTIAL/PLATELET
BASOS: 1 %
Basophils Absolute: 0.1 10*3/uL (ref 0.0–0.2)
EOS (ABSOLUTE): 0.8 10*3/uL — ABNORMAL HIGH (ref 0.0–0.4)
EOS: 8 %
HEMATOCRIT: 38.4 % (ref 34.0–46.6)
Hemoglobin: 12.3 g/dL (ref 11.1–15.9)
Immature Grans (Abs): 0 10*3/uL (ref 0.0–0.1)
Immature Granulocytes: 0 %
LYMPHS ABS: 3.7 10*3/uL — AB (ref 0.7–3.1)
Lymphs: 34 %
MCH: 28 pg (ref 26.6–33.0)
MCHC: 32 g/dL (ref 31.5–35.7)
MCV: 87 fL (ref 79–97)
Monocytes Absolute: 0.6 10*3/uL (ref 0.1–0.9)
Monocytes: 6 %
NEUTROS ABS: 5.6 10*3/uL (ref 1.4–7.0)
Neutrophils: 51 %
Platelets: 407 10*3/uL (ref 150–450)
RBC: 4.4 x10E6/uL (ref 3.77–5.28)
RDW: 13.4 % (ref 12.3–15.4)
WBC: 10.9 10*3/uL — ABNORMAL HIGH (ref 3.4–10.8)

## 2017-12-03 LAB — COMPREHENSIVE METABOLIC PANEL
A/G RATIO: 1.2 (ref 1.2–2.2)
ALK PHOS: 88 IU/L (ref 39–117)
ALT: 30 IU/L (ref 0–32)
AST: 29 IU/L (ref 0–40)
Albumin: 3.9 g/dL (ref 3.5–5.5)
BILIRUBIN TOTAL: 0.2 mg/dL (ref 0.0–1.2)
BUN/Creatinine Ratio: 19 (ref 9–23)
BUN: 11 mg/dL (ref 6–24)
CO2: 24 mmol/L (ref 20–29)
Calcium: 8.8 mg/dL (ref 8.7–10.2)
Chloride: 103 mmol/L (ref 96–106)
Creatinine, Ser: 0.59 mg/dL (ref 0.57–1.00)
GFR calc Af Amer: 129 mL/min/{1.73_m2} (ref >59)
GFR, EST NON AFRICAN AMERICAN: 112 mL/min/{1.73_m2} (ref >59)
GLOBULIN, TOTAL: 3.2 g/dL (ref 1.5–4.5)
Glucose: 114 mg/dL — ABNORMAL HIGH (ref 65–99)
POTASSIUM: 4.2 mmol/L (ref 3.5–5.2)
SODIUM: 141 mmol/L (ref 134–144)
Total Protein: 7.1 g/dL (ref 6.0–8.5)

## 2017-12-03 LAB — LIPID PANEL W/O CHOL/HDL RATIO
Cholesterol, Total: 209 mg/dL — ABNORMAL HIGH (ref 100–199)
HDL: 45 mg/dL (ref >39)
LDL CALC: 131 mg/dL — AB (ref 0–99)
Triglycerides: 163 mg/dL — ABNORMAL HIGH (ref 0–149)
VLDL Cholesterol Cal: 33 mg/dL (ref 5–40)

## 2017-12-03 LAB — MICROALBUMIN / CREATININE URINE RATIO
CREATININE, UR: 171.3 mg/dL
MICROALB/CREAT RATIO: 10.7 mg/g{creat} (ref 0.0–30.0)
Microalbumin, Urine: 18.4 ug/mL

## 2017-12-03 LAB — HGB A1C W/O EAG: Hgb A1c MFr Bld: 6.3 % — ABNORMAL HIGH (ref 4.8–5.6)

## 2017-12-03 LAB — HIV ANTIBODY (ROUTINE TESTING W REFLEX): HIV SCREEN 4TH GENERATION: NONREACTIVE

## 2017-12-04 MED ORDER — ATORVASTATIN CALCIUM 20 MG PO TABS: 20.0000 mg | ORAL_TABLET | Freq: Every day | ORAL | 11 refills | Status: DC

## 2017-12-04 NOTE — Addendum Note (Signed)
Addended by: Marcene DuosMULBERRY, Demaurion Dicioccio M on: 12/04/2017 08:39 AM   Modules accepted: Orders

## 2017-12-05 LAB — CYTOLOGY - PAP

## 2017-12-18 ENCOUNTER — Other Ambulatory Visit (HOSPITAL_COMMUNITY): Payer: Self-pay | Admitting: *Deleted

## 2017-12-18 DIAGNOSIS — N6452 Nipple discharge: Secondary | ICD-10-CM

## 2018-01-22 ENCOUNTER — Ambulatory Visit (HOSPITAL_COMMUNITY): Payer: Self-pay

## 2018-01-22 ENCOUNTER — Other Ambulatory Visit: Payer: Self-pay

## 2018-01-26 ENCOUNTER — Ambulatory Visit: Payer: Self-pay | Admitting: Internal Medicine

## 2018-02-13 ENCOUNTER — Encounter: Payer: Self-pay | Admitting: Internal Medicine

## 2018-02-13 ENCOUNTER — Ambulatory Visit: Payer: Self-pay | Admitting: Internal Medicine

## 2018-02-13 VITALS — BP 98/60 | HR 86 | Resp 12 | Ht 59.5 in | Wt 273.0 lb

## 2018-02-13 DIAGNOSIS — Z79899 Other long term (current) drug therapy: Secondary | ICD-10-CM

## 2018-02-13 DIAGNOSIS — B351 Tinea unguium: Secondary | ICD-10-CM

## 2018-02-13 DIAGNOSIS — E1165 Type 2 diabetes mellitus with hyperglycemia: Secondary | ICD-10-CM

## 2018-02-13 DIAGNOSIS — E782 Mixed hyperlipidemia: Secondary | ICD-10-CM

## 2018-02-13 LAB — GLUCOSE, POCT (MANUAL RESULT ENTRY): POC GLUCOSE: 72 mg/dL (ref 70–99)

## 2018-02-13 NOTE — Progress Notes (Signed)
   Subjective:    Patient ID: Mackenzie Ford, female    DOB: 1975-11-15, 42 y.o.   MRN: [id number]  HPI   1.  Hyperlipidemia:  Did not pick up Atorvastatin for elevated cholesterol.  Does not want it at [place].  She would prefer the medication to be sent to [facility].    2.  Toenail onychomycosis:  Started on Terbinafine 250 mg daily maybe 9 weeks ago.   She is not spraying inside of shoes nor is she cleaning the shower floor. She has basically about 3 weeks left of Terbinafine.    Current Meds  Medication Sig  . atorvastatin (LIPITOR) 20 MG tablet Take 1 tablet (20 mg total) by mouth daily.  . Blood Glucose Monitoring Suppl (AGAMATRIX PRESTO) w/Device KIT Check sugars twice daily before meals  . glipiZIDE (GLUCOTROL) 5 MG tablet Take 1 tablet (5 mg total) by mouth 2 (two) times daily before a meal.  . glucose blood (AGAMATRIX PRESTO TEST) test strip Check sugars twice daily before meals  . metFORMIN (GLUCOPHAGE) 500 MG tablet Take 1 tablet (500 mg total) by mouth 2 (two) times daily with a meal.  . Omega-3 Fatty Acids (FISH OIL) 1200 MG CAPS Take by mouth daily.  Marland Kitchen terbinafine (LAMISIL) 250 MG tablet 1 tab by mouth daily for 84 days.    No Known Allergies     Review of Systems     Objective:   Physical Exam NAD Right middle finger nail with discoloration and separation--apparently history of injury with a machine when she was 14.   Great and smallest toenails with thickening and foreshortening and brownish discoloration. Does appear to have faint clearing at base of these nails.      Assessment & Plan:  1.  Hyperlipidemia:  Will hold on Atorvastatin.  When prescribed, will send to [facility] instead of [facility].  Wait until done with Terbinafine.  2.  Toenail and possibly one fingernail onychomycosis:  Has 3 more weeks.  Will have her follow up then and if minimal change, extend treatment. They are to call if they get home and she has less than 3 weeks less to extend  treatment or if more than 3 weeks, to extend follow up time.

## 2018-02-13 NOTE — Patient Instructions (Signed)
For foot and toenail fungus:  Spray your shoes with Lysol or Lotrimin Antifungal spray when you start the medication for your feet.   Re-spray your the shoes you wear with each wearing and allow to dry before wearing again You will need to continue to spray the shoes you have during the infection of your toenails and feet have been thrown out due to wear.  Once your toenails and feet are clear of infection, the shoes you buy new do not necessarily need to be sprayed. Clean your shower floor once to twice daily with bleach containing cleaner. 

## 2018-02-14 LAB — HEPATIC FUNCTION PANEL
ALBUMIN: 3.4 g/dL — AB (ref 3.5–5.5)
ALT: 32 IU/L (ref 0–32)
AST: 26 IU/L (ref 0–40)
Alkaline Phosphatase: 92 IU/L (ref 39–117)
Bilirubin, Direct: 0.06 mg/dL (ref 0.00–0.40)
Total Protein: 6.6 g/dL (ref 6.0–8.5)

## 2018-02-19 ENCOUNTER — Ambulatory Visit (HOSPITAL_COMMUNITY): Payer: Self-pay

## 2018-02-19 ENCOUNTER — Other Ambulatory Visit: Payer: Self-pay

## 2018-03-11 ENCOUNTER — Ambulatory Visit: Payer: Self-pay | Admitting: Internal Medicine

## 2018-06-04 ENCOUNTER — Ambulatory Visit: Payer: Self-pay | Admitting: Internal Medicine

## 2018-11-04 ENCOUNTER — Other Ambulatory Visit: Payer: Self-pay | Admitting: Internal Medicine

## 2019-09-03 ENCOUNTER — Ambulatory Visit (INDEPENDENT_AMBULATORY_CARE_PROVIDER_SITE_OTHER): Payer: Self-pay | Admitting: Primary Care

## 2019-09-03 ENCOUNTER — Encounter (INDEPENDENT_AMBULATORY_CARE_PROVIDER_SITE_OTHER): Payer: Self-pay | Admitting: Primary Care

## 2019-09-03 ENCOUNTER — Other Ambulatory Visit: Payer: Self-pay

## 2019-09-03 VITALS — BP 130/80 | HR 77 | Temp 97.3°F | Ht 60.24 in | Wt 294.6 lb

## 2019-09-03 DIAGNOSIS — Z7689 Persons encountering health services in other specified circumstances: Secondary | ICD-10-CM

## 2019-09-03 DIAGNOSIS — E119 Type 2 diabetes mellitus without complications: Secondary | ICD-10-CM

## 2019-09-03 DIAGNOSIS — E782 Mixed hyperlipidemia: Secondary | ICD-10-CM

## 2019-09-03 DIAGNOSIS — Z6841 Body Mass Index (BMI) 40.0 and over, adult: Secondary | ICD-10-CM

## 2019-09-03 LAB — POCT GLYCOSYLATED HEMOGLOBIN (HGB A1C): Hemoglobin A1C: 6.7 % — AB (ref 4.0–5.6)

## 2019-09-03 MED ORDER — GLIPIZIDE 5 MG PO TABS: ORAL_TABLET | ORAL | 1 refills | Status: DC

## 2019-09-03 MED ORDER — LISINOPRIL 10 MG PO TABS: 10.0000 mg | ORAL_TABLET | Freq: Every day | ORAL | 3 refills | Status: DC

## 2019-09-03 MED ORDER — METFORMIN HCL 500 MG PO TABS: ORAL_TABLET | ORAL | 1 refills | Status: DC

## 2019-09-03 NOTE — Progress Notes (Signed)
Established Patient Office Visit  Subjective:  Patient ID: Mackenzie Ford, female    DOB: 11/14/1975  Age: 44 y.o. MRN: [id number]  CC:  Chief Complaint  Patient presents with  . New Patient (Initial Visit)    diabetes     HPI (interputor Gilberto Better [phone]) Ms.Mackenzie Ford is a 44 year old morbid obse Hispanic female presents today to establish care and management of diabetes (a1c 6.7). Does have poly-dipsia/uria and phagia .Blood pressure 130/80 Denies shortness of breath, headaches, chest pain or lower extremity edema, sudden onset, vision changes, unilateral weakness, dizziness, paresthesias. She is not on a ACE/ARB.   Past Medical History:  Diagnosis Date  . Hyperlipidemia 2017  . Microalbuminuria 02/10/2017  . Morbid obesity with body mass index (BMI) of 50.0 to 59.9 in adult [facility]) 2016  . Type 2 diabetes mellitus with hyperglycemia, without long-term current use of insulin ([place]) 05/21/2007    Past Surgical History:  Procedure Laterality Date  . CESAREAN SECTION    . CESAREAN SECTION    . CESAREAN SECTION    . TUBAL LIGATION  06/28/2007    Family History  Problem Relation Age of Onset  . Diabetes Mother   . Cancer Mother 59       Uterine  . HIV/AIDS Brother   . Asthma Son     Social History   Socioeconomic History  . Marital status: Married    Spouse name: [name]  . Number of children: 3  . Years of education: 9  . Highest education level: Not on file  Occupational History  . Occupation: Housewife  Tobacco Use  . Smoking status: Never Smoker  . Smokeless tobacco: Never Used  Substance and Sexual Activity  . Alcohol use: No    Alcohol/week: 0.0 standard drinks  . Drug use: No  . Sexual activity: Yes    Partners: Male    Birth control/protection: Surgical  Other Topics Concern  . Not on file  Social History Narrative   Came to [place]   Lives at home with husband who is patient here, Tyna Jaksch, and their 3 children.   Social Determinants  of Health   Financial Resource Strain:   . Difficulty of Paying Living Expenses:   Food Insecurity:   . Worried About Charity fundraiser in the Last Year:   . Arboriculturist in the Last Year:   Transportation Needs:   . Film/video editor (Medical):   Marland Kitchen Lack of Transportation (Non-Medical):   Physical Activity:   . Days of Exercise per Week:   . Minutes of Exercise per Session:   Stress:   . Feeling of Stress :   Social Connections:   . Frequency of Communication with Friends and Family:   . Frequency of Social Gatherings with Friends and Family:   . Attends Religious Services:   . Active Member of Clubs or Organizations:   . Attends Archivist Meetings:   Marland Kitchen Marital Status:   Intimate Partner Violence:   . Fear of Current or Ex-Partner:   . Emotionally Abused:   Marland Kitchen Physically Abused:   . Sexually Abused:     Outpatient Medications Prior to Visit  Medication Sig Dispense Refill  . atorvastatin (LIPITOR) 20 MG tablet Take 1 tablet (20 mg total) by mouth daily. (Patient not taking: Reported on 09/03/2019) 30 tablet 11  . Blood Glucose Monitoring Suppl (AGAMATRIX PRESTO) w/Device KIT Check sugars twice daily before meals (Patient not taking: Reported  on 09/03/2019) 1 kit 0  . glucose blood (AGAMATRIX PRESTO TEST) test strip Check sugars twice daily before meals (Patient not taking: Reported on 09/03/2019) 100 each 12  . Omega-3 Fatty Acids (FISH OIL) 1200 MG CAPS Take by mouth daily.    Marland Kitchen glipiZIDE (GLUCOTROL) 5 MG tablet TAKE 1 TABLET BY MOUTH TWICE DAILY BEFORE  A  MEAL. (Patient not taking: Reported on 09/03/2019) 60 tablet 3  . metFORMIN (GLUCOPHAGE) 500 MG tablet TAKE 1 TABLET BY MOUTH TWICE DAILY WITH  A  MEAL. (Patient not taking: Reported on 09/03/2019) 60 tablet 3  . terbinafine (LAMISIL) 250 MG tablet 1 tab by mouth daily for 84 days. 84 tablet 0   No facility-administered medications prior to visit.    No Known Allergies  ROS Review of Systems   Endocrine: Positive for polydipsia, polyphagia and polyuria.  All other systems reviewed and are negative.     Objective:    Physical Exam  Constitutional: She appears well-developed and well-nourished.  Morbid obesity    BP 130/80 (BP Location: Right Arm, Patient Position: Sitting, Cuff Size: Large)   Pulse 77   Temp (!) 97.3 F (36.3 C) (Temporal)   Ht 5' 0.24" (1.53 m)   Wt 294 lb 9.6 oz (133.6 kg)   SpO2 95%   BMI 57.09 kg/m  Wt Readings from Last 3 Encounters:  09/03/19 294 lb 9.6 oz (133.6 kg)  02/13/18 273 lb (123.8 kg)  12/02/17 266 lb (120.7 kg)     Health Maintenance Due  Topic Date Due  . OPHTHALMOLOGY EXAM  05/20/2018    There are no preventive care reminders to display for this patient.  No results found for: TSH Lab Results  Component Value Date   WBC 10.9 (H) 12/02/2017   HGB 12.3 12/02/2017   HCT 38.4 12/02/2017   MCV 87 12/02/2017   PLT 407 12/02/2017   Lab Results  Component Value Date   NA 141 12/02/2017   K 4.2 12/02/2017   CO2 24 12/02/2017   GLUCOSE 114 (H) 12/02/2017   BUN 11 12/02/2017   CREATININE 0.59 12/02/2017   BILITOT <0.2 02/13/2018   ALKPHOS 92 02/13/2018   AST 26 02/13/2018   ALT 32 02/13/2018   PROT 6.6 02/13/2018   ALBUMIN 3.4 (L) 02/13/2018   CALCIUM 8.8 12/02/2017   ANIONGAP 13 11/25/2013   Lab Results  Component Value Date   CHOL 209 (H) 12/02/2017   Lab Results  Component Value Date   HDL 45 12/02/2017   Lab Results  Component Value Date   LDLCALC 131 (H) 12/02/2017   Lab Results  Component Value Date   TRIG 163 (H) 12/02/2017   No results found for: [facility] Lab Results  Component Value Date   HGBA1C 6.7 (A) 09/03/2019      Assessment & Plan:  [name] was seen today for new patient (initial visit).  Diagnoses and all orders for this visit:  Encounter to establish care [name], NP-C will be your  (PCP) she is mastered prepared . She is skilled to diagnosed and treat illness. Also  able to answer health concern as well as continuing care of varied medical conditions, not limited by cause, organ system, or diagnosis.    Type 2 diabetes mellitus without complication, without long-term current use of insulin (HCC) ADA recommends the following therapeutic goals for glycemic control related to A1c measurements: Goal of therapy: Less than 6.5 hemoglobin A1c. A1C today ^.7 on oral agents low doses .  She will decrease foods that are high in carbohydrates are the following rice, potatoes, breads, sugars, and pastas.  Reduction in the intake (eating) will assist in lowering your blood sugars. -     HgB A1c -     metFORMIN (GLUCOPHAGE) 500 MG tablet; TAKE 1 TABLET BY MOUTH TWICE DAILY WITH  A  MEAL. -     glipiZIDE (GLUCOTROL) 5 MG tablet; TAKE 1 TABLET BY MOUTH TWICE DAILY BEFORE  A  MEAL. -     CBC with Differential -     Microalbumin, urine -     Ambulatory referral to Ophthalmology  Morbid obesity with body mass index (BMI) of 50.0 to 59.9 in adult [facility]) Morbid  Obesity is greater than BMI 40  indicating an excess in caloric intake or underlining conditions. This may lead to other co-morbidities. Risk include cardiovascular disease and respiratory compliaction Lifestyle modifications of diet and exercise may reduce obesity. Asked patient what are we going to do about this weight she state I will start walking -     TSH + free T4  Mixed hyperlipidemia Try to decrease your fatty foods, red meat, cheese, milk and increase fiber like whole grains and veggies.  After reviewing labs will determine medication .  -     Lipid Panel -     Comprehensive metabolic panel  Comprehensive diabetic foot examination, type 2 DM, encounter for [facility]) Sensory exam of the foot is normal, tested with the monofilament. Good pulses, no lesions or ulcers, good peripheral pulses.  Other orders -     lisinopril (ZESTRIL) 10 MG tablet; Take 1 tablet (10 mg total) by mouth daily.     Follow-up: Return  in about 3 months (around 12/03/2019) for In person HTN/Bp.    Kerin Perna, NP

## 2019-09-03 NOTE — Patient Instructions (Signed)
Obesidad en los adultos Obesity, Adult La obesidad es un exceso de grasa corporal. Ser obeso significa que su peso es ms de lo que es saludable para usted. El IMC es un nmero que indica la cantidad de grasa corporal que tiene una persona. Si usted tiene un ndice de masa corporal (IMC) de 30o ms, esto significa que es obeso. A menudo, la causa de la obesidad es comer o beber ms caloras que las que el cuerpo usa. Cambiar el estilo de vida puede ayudarlo a bajar de peso. La obesidad puede causar problemas de salud graves, como los siguientes:  Accidente cerebrovascular.  Arteriopata coronaria (EAC).  Diabetes tipo 2.  Algunos tipos de cncer, incluido el cncer de colon, mama, tero y vescula.  Artrosis.  Presin arterial alta (hipertensin arterial).  Colesterol alto.  Apnea del sueo.  Clculos en la vescula.  Problemas de esterilidad. Cules son las causas?  Consumir todos los das alimentos con altos niveles de caloras, azcar y grasa.  Nacer con genes que pueden hacerlo ms propenso a ser obeso.  Tener una afeccin mdica que causa obesidad.  Tomar ciertos medicamentos.  Permanecer mucho tiempo sentado (tener un estilo de vida sedentario).  No dormir lo suficiente.  Beber gran cantidad de bebidas con azcar. Qu incrementa el riesgo?  Tener antecedentes familiares de obesidad.  Ser mujer afroamericana.  Ser hombre de origen hispano.  Vivir en un rea con acceso limitado a las siguientes posibilidades: ? Parques, centros recreativos o veredas. ? Alimentos saludables, como se venden en tiendas de comestibles y mercados de agricultores. Cules son los signos o los sntomas? El principal signo es tener demasiada grasa corporal. Cmo se trata?  El tratamiento de esta afeccin frecuentemente incluye cambiar el estilo de vida. El tratamiento puede incluir: ? Cambios en la dieta. Esto puede incluir crear un plan de alimentacin saludable. ? Realizar  actividad fsica. Puede incluir una actividad que hace que el corazn lata ms rpido (ejercicio aerbico) y entrenamiento de fuerza. Trabaje con su mdico para disear un programa que funcione para usted. ? Medicamentos para ayudarlo a perder peso. Pueden utilizarse si no puede perder 1 libra (450g) por semana despus de 6 semanas de alimentacin saludable y ms ejercicio. ? Tratar las afecciones que causan la obesidad. ? Ciruga. Las opciones pueden incluir bandas gstricas y bypass gstrico. Esto puede realizarse en las siguientes situaciones:  Otros tratamientos no mejoraron su afeccin.  Tiene un IMC de 40 o superior.  Tiene problemas de salud potencialmente mortales relacionados con la obesidad. Siga estas instrucciones en su casa: Comida y bebida   Siga las instrucciones del mdico respecto de las comidas y las bebidas. Su mdico puede recomendarle lo siguiente: ? Limitar las comidas rpidas, los dulces y las colaciones procesadas. ? Elegir opciones con bajo contenido de grasas. Por ejemplo, leche descremada en lugar de leche entera. ? Consumir 5o ms porciones de frutas o verduras por da. ? Comer en casa con ms frecuencia. Esto le da ms control sobre lo que come. ? Cuando coma afuera, elija alimentos saludables. ? Aprenda a leer las etiquetas de los alimentos. Esto le ayudar a aprender qu cantidad de alimento hay en una porcin. ? Tenga a mano colaciones con bajo contenido de grasas. ? Evite las bebidas que contengan mucha azcar. Estas incluyen refrescos, jugo de frutas, t helado con azcar y leche saborizada.  Beba suficiente agua para mantener el pis (la orina) de color amarillo plido.  No siga las dietas de moda. Actividad   fsica  Haga ejercicios con frecuencia, como se lo haya indicado el mdico. La mayora de los adultos deben hacer hasta 150minutos de ejercicio de intensidad moderada cada semana.Pregntele al mdico lo siguiente: ? Los tipos de ejercicios que  son seguros para usted. ? La frecuencia con la que debe hacer los ejercicios.  Precaliente y elongue adecuadamente antes de hacer actividad fsica.  Haga un estiramiento lento despus de la actividad (relajacin).  Descanse entre los perodos de actividad. Estilo de vida  Trabaje con su mdico y con un experto en alimentacin (nutricionista) para establecer un objetivo de prdida de peso que sea adecuado para usted.  Limite el tiempo que pasa frente a una pantalla.  Busque formas de recompensarse que no incluyan alimentos.  No beba alcohol si: ? El mdico le indica que no lo haga. ? Est embarazada, puede estar embarazada o est tratando de quedar embarazada.  Si bebe alcohol: ? Limite la cantidad que bebe a lo siguiente:  De 0 a 1 medida por da para las mujeres.  De 0 a 2 medidas por da para los hombres. ? Est atento a la cantidad de alcohol que hay en las bebidas que toma. En los [place], una medida equivale a una botella de cerveza de 12oz (355ml), un vaso de vino de 5oz (148ml) o un vaso de una bebida alcohlica de alta graduacin de 1oz (44ml). Instrucciones generales  Lleve un diario de su prdida de peso. Esto puede ayudarlo a mantener un registro de lo siguiente: ? Los alimentos que come. ? Cunto ejercicio realiza.  Tome los medicamentos de venta libre y los recetados solamente como se lo haya indicado el mdico.  Tome vitaminas y suplementos solamente como se lo haya indicado el mdico.  Considere participar en un grupo de apoyo.  Concurra a todas las visitas de seguimiento como se lo haya indicado el mdico. Esto es importante. Comunquese con un mdico si:  No puede alcanzar su objetivo de prdida de peso despus de haber modificado su dieta y su estilo de vida durante 6semanas. Solicite ayuda inmediatamente si:  Tiene dificultad para respirar.  Tiene pensamientos acerca de lastimarse. Resumen  La obesidad es un exceso de grasa  corporal.  Ser obeso significa que su peso es ms de lo que es saludable para usted.  Trabaje con su mdico para establecer un objetivo de prdida de peso.  Haga actividad fsica con regularidad tal como le indic el mdico. Esta informacin no tiene como fin reemplazar el consejo del mdico. Asegrese de hacerle al mdico cualquier pregunta que tenga. Document Revised: 01/29/2018 Document Reviewed: 01/29/2018 Elsevier Patient Education  2020 Elsevier Inc.  

## 2019-09-04 LAB — COMPREHENSIVE METABOLIC PANEL
ALT: 42 IU/L — ABNORMAL HIGH (ref 0–32)
AST: 37 IU/L (ref 0–40)
Albumin/Globulin Ratio: 1.3 (ref 1.2–2.2)
Albumin: 4.1 g/dL (ref 3.8–4.8)
Alkaline Phosphatase: 120 IU/L — ABNORMAL HIGH (ref 39–117)
BUN/Creatinine Ratio: 20 (ref 9–23)
BUN: 11 mg/dL (ref 6–24)
Bilirubin Total: 0.2 mg/dL (ref 0.0–1.2)
CO2: 21 mmol/L (ref 20–29)
Calcium: 9.2 mg/dL (ref 8.7–10.2)
Chloride: 105 mmol/L (ref 96–106)
Creatinine, Ser: 0.55 mg/dL — ABNORMAL LOW (ref 0.57–1.00)
GFR calc Af Amer: 131 mL/min/{1.73_m2} (ref >59)
GFR calc non Af Amer: 114 mL/min/{1.73_m2} (ref >59)
Globulin, Total: 3.2 g/dL (ref 1.5–4.5)
Glucose: 113 mg/dL — ABNORMAL HIGH (ref 65–99)
Potassium: 3.9 mmol/L (ref 3.5–5.2)
Sodium: 142 mmol/L (ref 134–144)
Total Protein: 7.3 g/dL (ref 6.0–8.5)

## 2019-09-04 LAB — CBC WITH DIFFERENTIAL/PLATELET
Basophils Absolute: 0.1 10*3/uL (ref 0.0–0.2)
Basos: 1 %
EOS (ABSOLUTE): 0.4 10*3/uL (ref 0.0–0.4)
Eos: 4 %
Hematocrit: 41.5 % (ref 34.0–46.6)
Hemoglobin: 13.3 g/dL (ref 11.1–15.9)
Immature Grans (Abs): 0.1 10*3/uL (ref 0.0–0.1)
Immature Granulocytes: 1 %
Lymphocytes Absolute: 4.2 10*3/uL — ABNORMAL HIGH (ref 0.7–3.1)
Lymphs: 36 %
MCH: 28.7 pg (ref 26.6–33.0)
MCHC: 32 g/dL (ref 31.5–35.7)
MCV: 90 fL (ref 79–97)
Monocytes Absolute: 0.8 10*3/uL (ref 0.1–0.9)
Monocytes: 7 %
Neutrophils Absolute: 6.1 10*3/uL (ref 1.4–7.0)
Neutrophils: 51 %
Platelets: 407 10*3/uL (ref 150–450)
RBC: 4.63 x10E6/uL (ref 3.77–5.28)
RDW: 12.4 % (ref 11.7–15.4)
WBC: 11.7 10*3/uL — ABNORMAL HIGH (ref 3.4–10.8)

## 2019-09-04 LAB — LIPID PANEL
Chol/HDL Ratio: 3.9 ratio (ref 0.0–4.4)
Cholesterol, Total: 209 mg/dL — ABNORMAL HIGH (ref 100–199)
HDL: 53 mg/dL (ref >39)
LDL Chol Calc (NIH): 122 mg/dL — ABNORMAL HIGH (ref 0–99)
Triglycerides: 193 mg/dL — ABNORMAL HIGH (ref 0–149)
VLDL Cholesterol Cal: 34 mg/dL (ref 5–40)

## 2019-09-04 LAB — TSH+FREE T4
Free T4: 0.97 ng/dL (ref 0.82–1.77)
TSH: 2.65 u[IU]/mL (ref 0.450–4.500)

## 2019-09-04 LAB — MICROALBUMIN, URINE: Microalbumin, Urine: 30.9 ug/mL

## 2019-09-08 ENCOUNTER — Other Ambulatory Visit (INDEPENDENT_AMBULATORY_CARE_PROVIDER_SITE_OTHER): Payer: Self-pay | Admitting: Primary Care

## 2019-09-08 MED ORDER — ATORVASTATIN CALCIUM 20 MG PO TABS: 20.0000 mg | ORAL_TABLET | Freq: Every day | ORAL | 6 refills | Status: DC

## 2019-09-10 ENCOUNTER — Telehealth (INDEPENDENT_AMBULATORY_CARE_PROVIDER_SITE_OTHER): Payer: Self-pay

## 2019-09-10 NOTE — Telephone Encounter (Signed)
Call placed to patient using pacific interpreter [phone]) after verifying date of birth patient was given results. She is aware that her cholesterol is elevated. Atorvastatin 20 mg sent to pharmacy to help lower cholesterol. Advised her to decrease fatty food, red meat, cheese, milk; increase fiber with more whole grains and veggies. Thyroid and all other labs normal. She verbalized understanding. Maryjean Morn, CMA

## 2019-09-10 NOTE — Telephone Encounter (Signed)
-----   Message from Grayce Sessions, NP sent at 09/08/2019  9:24 AM EDT ----- cholesterol is elevated sent in atorvastatin 20mg  take at night  Try to decrease your fatty foods, red meat, cheese, milk and increase fiber like whole grains and veggies. Thyroid is normal and other labs

## 2019-10-11 ENCOUNTER — Other Ambulatory Visit: Payer: Self-pay

## 2019-10-11 ENCOUNTER — Ambulatory Visit: Payer: Self-pay | Attending: Family Medicine

## 2019-12-03 ENCOUNTER — Ambulatory Visit (INDEPENDENT_AMBULATORY_CARE_PROVIDER_SITE_OTHER): Payer: Self-pay | Admitting: Primary Care

## 2019-12-03 ENCOUNTER — Encounter (INDEPENDENT_AMBULATORY_CARE_PROVIDER_SITE_OTHER): Payer: Self-pay | Admitting: Primary Care

## 2019-12-03 ENCOUNTER — Other Ambulatory Visit: Payer: Self-pay

## 2019-12-03 VITALS — BP 108/74 | HR 75 | Temp 98.4°F | Ht 59.5 in | Wt 299.2 lb

## 2019-12-03 DIAGNOSIS — E119 Type 2 diabetes mellitus without complications: Secondary | ICD-10-CM

## 2019-12-03 DIAGNOSIS — E782 Mixed hyperlipidemia: Secondary | ICD-10-CM

## 2019-12-03 DIAGNOSIS — I10 Essential (primary) hypertension: Secondary | ICD-10-CM

## 2019-12-03 DIAGNOSIS — Z9189 Other specified personal risk factors, not elsewhere classified: Secondary | ICD-10-CM

## 2019-12-03 DIAGNOSIS — Z1159 Encounter for screening for other viral diseases: Secondary | ICD-10-CM

## 2019-12-03 LAB — POCT CBG (FASTING - GLUCOSE)-MANUAL ENTRY: Glucose Fasting, POC: 137 mg/dL — AB (ref 70–99)

## 2019-12-03 MED ORDER — LISINOPRIL 5 MG PO TABS: 5.0000 mg | ORAL_TABLET | Freq: Every day | ORAL | 3 refills | Status: DC

## 2019-12-03 MED ORDER — ATORVASTATIN CALCIUM 20 MG PO TABS: 20.0000 mg | ORAL_TABLET | Freq: Every day | ORAL | 1 refills | Status: DC

## 2019-12-03 MED ORDER — GLIPIZIDE 5 MG PO TABS: ORAL_TABLET | ORAL | 1 refills | Status: DC

## 2019-12-03 MED ORDER — METFORMIN HCL 500 MG PO TABS: ORAL_TABLET | ORAL | 1 refills | Status: DC

## 2019-12-10 NOTE — Progress Notes (Signed)
Established Patient Office Visit  Subjective:  Patient ID: Mackenzie Ford, female    DOB: July 16, 1975  Age: 44 y.o. MRN: [id number]  CC:  Chief Complaint  Patient presents with  . Hypertension    HPI Ms.Mackenzie Ford is a 44 year old female who presents for management of hypertension-denies shortness of breath, headaches, chest pain or lower extremity edema.  Type 2 diabetes denies polyuria, polyphagia, polydipsia  Past Medical History:  Diagnosis Date  . Hyperlipidemia 2017  . Microalbuminuria 02/10/2017  . Morbid obesity with body mass index (BMI) of 50.0 to 59.9 in adult [facility]) 2016  . Type 2 diabetes mellitus with hyperglycemia, without long-term current use of insulin ([place]) 05/21/2007    Past Surgical History:  Procedure Laterality Date  . CESAREAN SECTION    . CESAREAN SECTION    . CESAREAN SECTION    . TUBAL LIGATION  06/28/2007    Family History  Problem Relation Age of Onset  . Diabetes Mother   . Cancer Mother 23       Uterine  . HIV/AIDS Brother   . Asthma Son     Social History   Socioeconomic History  . Marital status: Married    Spouse name: [name]  . Number of children: 3  . Years of education: 9  . Highest education level: Not on file  Occupational History  . Occupation: Housewife  Tobacco Use  . Smoking status: Never Smoker  . Smokeless tobacco: Never Used  Vaping Use  . Vaping Use: Never used  Substance and Sexual Activity  . Alcohol use: No    Alcohol/week: 0.0 standard drinks  . Drug use: No  . Sexual activity: Yes    Partners: Male    Birth control/protection: Surgical  Other Topics Concern  . Not on file  Social History Narrative   Came to [place]   Lives at home with husband who is patient here, Tyna Jaksch, and their 3 children.   Social Determinants of Health   Financial Resource Strain:   . Difficulty of Paying Living Expenses:   Food Insecurity:   . Worried About Charity fundraiser in the Last Year:   . Arts development officer in the Last Year:   Transportation Needs:   . Film/video editor (Medical):   Marland Kitchen Lack of Transportation (Non-Medical):   Physical Activity:   . Days of Exercise per Week:   . Minutes of Exercise per Session:   Stress:   . Feeling of Stress :   Social Connections:   . Frequency of Communication with Friends and Family:   . Frequency of Social Gatherings with Friends and Family:   . Attends Religious Services:   . Active Member of Clubs or Organizations:   . Attends Archivist Meetings:   Marland Kitchen Marital Status:   Intimate Partner Violence:   . Fear of Current or Ex-Partner:   . Emotionally Abused:   Marland Kitchen Physically Abused:   . Sexually Abused:     Outpatient Medications Prior to Visit  Medication Sig Dispense Refill  . Blood Glucose Monitoring Suppl (AGAMATRIX PRESTO) w/Device KIT Check sugars twice daily before meals 1 kit 0  . glucose blood (AGAMATRIX PRESTO TEST) test strip Check sugars twice daily before meals 100 each 12  . Omega-3 Fatty Acids (FISH OIL) 1200 MG CAPS Take by mouth daily.    Marland Kitchen atorvastatin (LIPITOR) 20 MG tablet Take 1 tablet (20 mg total) by mouth daily. 30 tablet 6  .  glipiZIDE (GLUCOTROL) 5 MG tablet TAKE 1 TABLET BY MOUTH TWICE DAILY BEFORE  A  MEAL. 180 tablet 1  . lisinopril (ZESTRIL) 10 MG tablet Take 1 tablet (10 mg total) by mouth daily. 90 tablet 3  . metFORMIN (GLUCOPHAGE) 500 MG tablet TAKE 1 TABLET BY MOUTH TWICE DAILY WITH  A  MEAL. 180 tablet 1   No facility-administered medications prior to visit.    No Known Allergies  ROS Review of Systems  All other systems reviewed and are negative.     Objective:    Physical Exam Vitals reviewed.  Constitutional:      Appearance: She is obese.  HENT:     Head: Normocephalic.     Right Ear: Tympanic membrane normal.     Left Ear: Tympanic membrane normal.     Nose: Nose normal.  Cardiovascular:     Rate and Rhythm: Normal rate and regular rhythm.     Pulses: Normal  pulses.     Heart sounds: Normal heart sounds.  Pulmonary:     Effort: Pulmonary effort is normal.     Breath sounds: Normal breath sounds.  Abdominal:     General: Bowel sounds are normal. There is distension.  Musculoskeletal:        General: Normal range of motion.     Cervical back: Normal range of motion and neck supple.  Skin:    General: Skin is warm and dry.  Neurological:     Mental Status: She is alert and oriented to person, place, and time.  Psychiatric:        Mood and Affect: Mood normal.        Behavior: Behavior normal.     BP 108/74 (BP Location: Right Arm, Patient Position: Sitting, Cuff Size: Large)   Pulse 75   Temp 98.4 F (36.9 C) (Oral)   Ht 4' 11.5" (1.511 m)   Wt 299 lb 3.2 oz (135.7 kg)   SpO2 95%   BMI 59.42 kg/m  Wt Readings from Last 3 Encounters:  12/03/19 299 lb 3.2 oz (135.7 kg)  09/03/19 294 lb 9.6 oz (133.6 kg)  02/13/18 273 lb (123.8 kg)     Health Maintenance Due  Topic Date Due  . Hepatitis C Screening  Never done  . COVID-19 Vaccine (1) Never done  . OPHTHALMOLOGY EXAM  05/20/2018    There are no preventive care reminders to display for this patient.  Lab Results  Component Value Date   TSH 2.650 09/03/2019   Lab Results  Component Value Date   WBC 11.7 (H) 09/03/2019   HGB 13.3 09/03/2019   HCT 41.5 09/03/2019   MCV 90 09/03/2019   PLT 407 09/03/2019   Lab Results  Component Value Date   NA 142 09/03/2019   K 3.9 09/03/2019   CO2 21 09/03/2019   GLUCOSE 113 (H) 09/03/2019   BUN 11 09/03/2019   CREATININE 0.55 (L) 09/03/2019   BILITOT 0.2 09/03/2019   ALKPHOS 120 (H) 09/03/2019   AST 37 09/03/2019   ALT 42 (H) 09/03/2019   PROT 7.3 09/03/2019   ALBUMIN 4.1 09/03/2019   CALCIUM 9.2 09/03/2019   ANIONGAP 13 11/25/2013   Lab Results  Component Value Date   CHOL 209 (H) 09/03/2019   Lab Results  Component Value Date   HDL 53 09/03/2019   Lab Results  Component Value Date   LDLCALC 122 (H) 09/03/2019    Lab Results  Component Value Date   TRIG 193 (H)  09/03/2019   Lab Results  Component Value Date   CHOLHDL 3.9 09/03/2019   Lab Results  Component Value Date   HGBA1C 6.7 (A) 09/03/2019      Assessment & Plan:   Mackenzie Ford was seen today for hypertension.  Diagnoses and all orders for this visit:  Type 2 diabetes mellitus without complication, without long-term current use of insulin (HCC) A1c September 03, 2019 6.7 slightly above ADA goal of less than 6.4 for control.  Discussed monitoring rice, potatoes, tortillas, breads, sodas and sweets.  Continue current diabetic medication no changes -     Glucose (CBG), Fasting -     Ambulatory referral to Ophthalmology -     Lipid panel; Future -     CBC with Differential; Future -     CMP14+EGFR; Future -     glipiZIDE (GLUCOTROL) 5 MG tablet; TAKE 1 TABLET BY MOUTH TWICE DAILY BEFORE  A  MEAL. -     metFORMIN (GLUCOPHAGE) 500 MG tablet; TAKE 1 TABLET BY MOUTH TWICE DAILY WITH  A  MEAL.  Essential hypertension Blood pressure is 108/74 decrease lisinopril from 23m to 5 mg daily.  Continue ACE inhibitor for renal protection -     lisinopril (ZESTRIL) 5 MG tablet; Take 1 tablet (5 mg total) by mouth daily.  Mixed hyperlipidemia Recommended for diabetics to be on a statin refill atorvastatin 20 mg take at night on follow-up will obtain fasting labs -     Lipid panel; Future  Encounter for hepatitis C virus screening test for high risk patient Healthcare maintenance and care care -     Hepatitis C Antibody; Future  Other orders -     atorvastatin (LIPITOR) 20 MG tablet; Take 1 tablet (20 mg total) by mouth daily.    Meds ordered this encounter  Medications  . lisinopril (ZESTRIL) 5 MG tablet    Sig: Take 1 tablet (5 mg total) by mouth daily.    Dispense:  90 tablet    Refill:  3  . glipiZIDE (GLUCOTROL) 5 MG tablet    Sig: TAKE 1 TABLET BY MOUTH TWICE DAILY BEFORE  A  MEAL.    Dispense:  180 tablet    Refill:  1  . metFORMIN  (GLUCOPHAGE) 500 MG tablet    Sig: TAKE 1 TABLET BY MOUTH TWICE DAILY WITH  A  MEAL.    Dispense:  180 tablet    Refill:  1  . atorvastatin (LIPITOR) 20 MG tablet    Sig: Take 1 tablet (20 mg total) by mouth daily.    Dispense:  90 tablet    Refill:  1    Follow-up: No follow-ups on file.    MKerin Perna NP

## 2019-12-31 ENCOUNTER — Other Ambulatory Visit: Payer: Self-pay

## 2019-12-31 ENCOUNTER — Ambulatory Visit (INDEPENDENT_AMBULATORY_CARE_PROVIDER_SITE_OTHER): Payer: Self-pay | Admitting: Primary Care

## 2019-12-31 ENCOUNTER — Encounter (INDEPENDENT_AMBULATORY_CARE_PROVIDER_SITE_OTHER): Payer: Self-pay | Admitting: Primary Care

## 2019-12-31 ENCOUNTER — Other Ambulatory Visit (HOSPITAL_COMMUNITY)
Admission: RE | Admit: 2019-12-31 | Discharge: 2019-12-31 | Disposition: A | Discharge status: Home / Self Care | Payer: Self-pay | Source: Ambulatory Visit | Attending: Primary Care | Admitting: Primary Care

## 2019-12-31 VITALS — BP 117/63 | HR 85 | Ht 59.0 in | Wt 298.0 lb

## 2019-12-31 DIAGNOSIS — N898 Other specified noninflammatory disorders of vagina: Secondary | ICD-10-CM | POA: Insufficient documentation

## 2019-12-31 NOTE — Progress Notes (Signed)
Here today for vaginal discharge with odor.

## 2020-01-02 NOTE — Progress Notes (Signed)
Acute Office Visit  Subjective:    Patient ID: Mackenzie Ford, female    DOB: Jul 31, 1975, 44 y.o.   MRN: [id number]  Chief Complaint  Patient presents with  . Vaginal Discharge    HPI  [name] is a 44 year old Hispanic female  is in today for vaginal itching and odor . She voices no other complaints or concerns.  Past Medical History:  Diagnosis Date  . Hyperlipidemia 2017  . Microalbuminuria 02/10/2017  . Morbid obesity with body mass index (BMI) of 50.0 to 59.9 in adult [facility]) 2016  . Type 2 diabetes mellitus with hyperglycemia, without long-term current use of insulin ([place]) 05/21/2007    Past Surgical History:  Procedure Laterality Date  . CESAREAN SECTION    . CESAREAN SECTION    . CESAREAN SECTION    . TUBAL LIGATION  06/28/2007    Family History  Problem Relation Age of Onset  . Diabetes Mother   . Cancer Mother 46       Uterine  . HIV/AIDS Brother   . Asthma Son     Social History   Socioeconomic History  . Marital status: Married    Spouse name: Mackenzie Ford  . Number of children: 3  . Years of education: 9  . Highest education level: Not on file  Occupational History  . Occupation: Housewife  Tobacco Use  . Smoking status: Never Smoker  . Smokeless tobacco: Never Used  Vaping Use  . Vaping Use: Never used  Substance and Sexual Activity  . Alcohol use: No    Alcohol/week: 0.0 standard drinks  . Drug use: No  . Sexual activity: Yes    Partners: Male    Birth control/protection: Surgical  Other Topics Concern  . Not on file  Social History Narrative   Came to [place]   Lives at home with husband who is patient here, Tyna Jaksch, and their 3 children.   Social Determinants of Health   Financial Resource Strain:   . Difficulty of Paying Living Expenses:   Food Insecurity:   . Worried About Charity fundraiser in the Last Year:   . Arboriculturist in the Last Year:   Transportation Needs:   . Film/video editor (Medical):    Marland Kitchen Lack of Transportation (Non-Medical):   Physical Activity:   . Days of Exercise per Week:   . Minutes of Exercise per Session:   Stress:   . Feeling of Stress :   Social Connections:   . Frequency of Communication with Friends and Family:   . Frequency of Social Gatherings with Friends and Family:   . Attends Religious Services:   . Active Member of Clubs or Organizations:   . Attends Archivist Meetings:   Marland Kitchen Marital Status:   Intimate Partner Violence:   . Fear of Current or Ex-Partner:   . Emotionally Abused:   Marland Kitchen Physically Abused:   . Sexually Abused:     Outpatient Medications Prior to Visit  Medication Sig Dispense Refill  . atorvastatin (LIPITOR) 20 MG tablet Take 1 tablet (20 mg total) by mouth daily. 90 tablet 1  . Blood Glucose Monitoring Suppl (AGAMATRIX PRESTO) w/Device KIT Check sugars twice daily before meals 1 kit 0  . glipiZIDE (GLUCOTROL) 5 MG tablet TAKE 1 TABLET BY MOUTH TWICE DAILY BEFORE  A  MEAL. 180 tablet 1  . glucose blood (AGAMATRIX PRESTO TEST) test strip Check sugars twice daily before meals 100 each  12  . lisinopril (ZESTRIL) 5 MG tablet Take 1 tablet (5 mg total) by mouth daily. 90 tablet 3  . metFORMIN (GLUCOPHAGE) 500 MG tablet TAKE 1 TABLET BY MOUTH TWICE DAILY WITH  A  MEAL. 180 tablet 1  . Omega-3 Fatty Acids (FISH OIL) 1200 MG CAPS Take by mouth daily.     No facility-administered medications prior to visit.    No Known Allergies  Review of Systems  Genitourinary: Positive for vaginal discharge.  All other systems reviewed and are negative.      Objective:    Physical Exam Vitals reviewed.  HENT:     Nose: Nose normal.  Cardiovascular:     Rate and Rhythm: Normal rate and regular rhythm.     Pulses: Normal pulses.     Heart sounds: Normal heart sounds.  Pulmonary:     Effort: Pulmonary effort is normal.     Breath sounds: Normal breath sounds.  Abdominal:     General: Bowel sounds are normal.  Musculoskeletal:         General: Normal range of motion.     Cervical back: Normal range of motion.  Skin:    General: Skin is warm and dry.  Neurological:     Mental Status: She is alert and oriented to person, place, and time.  Psychiatric:        Mood and Affect: Mood normal.        Behavior: Behavior normal.        Thought Content: Thought content normal.        Judgment: Judgment normal.     BP 117/63   Pulse 85   Ht _0  (1.499 m)   Wt 298 lb (135.2 kg)   SpO2 96%   BMI 60.19 kg/m  Wt Readings from Last 3 Encounters:  12/31/19 298 lb (135.2 kg)  12/03/19 299 lb 3.2 oz (135.7 kg)  09/03/19 294 lb 9.6 oz (133.6 kg)    Health Maintenance Due  Topic Date Due  . Hepatitis C Screening  Never done  . COVID-19 Vaccine (1) Never done  . OPHTHALMOLOGY EXAM  05/20/2018  . INFLUENZA VACCINE  12/19/2019    There are no preventive care reminders to display for this patient.   Lab Results  Component Value Date   TSH 2.650 09/03/2019   Lab Results  Component Value Date   WBC 11.7 (H) 09/03/2019   HGB 13.3 09/03/2019   HCT 41.5 09/03/2019   MCV 90 09/03/2019   PLT 407 09/03/2019   Lab Results  Component Value Date   NA 142 09/03/2019   K 3.9 09/03/2019   CO2 21 09/03/2019   GLUCOSE 113 (H) 09/03/2019   BUN 11 09/03/2019   CREATININE 0.55 (L) 09/03/2019   BILITOT 0.2 09/03/2019   ALKPHOS 120 (H) 09/03/2019   AST 37 09/03/2019   ALT 42 (H) 09/03/2019   PROT 7.3 09/03/2019   ALBUMIN 4.1 09/03/2019   CALCIUM 9.2 09/03/2019   ANIONGAP 13 11/25/2013   Lab Results  Component Value Date   CHOL 209 (H) 09/03/2019   Lab Results  Component Value Date   HDL 53 09/03/2019   Lab Results  Component Value Date   LDLCALC 122 (H) 09/03/2019   Lab Results  Component Value Date   TRIG 193 (H) 09/03/2019   Lab Results  Component Value Date   CHOLHDL 3.9 09/03/2019   Lab Results  Component Value Date   HGBA1C 6.7 (A) 09/03/2019  Assessment & Plan:  Brienne was seen  today for vaginal discharge.  Diagnoses and all orders for this visit:  Vaginal discharge -     Cervicovaginal ancillary only  No orders of the defined types were placed in this encounter.    Kerin Perna, NP

## 2020-01-03 LAB — CERVICOVAGINAL ANCILLARY ONLY
Bacterial Vaginitis (gardnerella): POSITIVE — AB
Candida Glabrata: NEGATIVE
Candida Vaginitis: POSITIVE — AB
Chlamydia: NEGATIVE
Comment: NEGATIVE
Comment: NEGATIVE
Comment: NEGATIVE
Comment: NEGATIVE
Comment: NEGATIVE
Comment: NORMAL
Neisseria Gonorrhea: NEGATIVE
Trichomonas: NEGATIVE

## 2020-01-05 ENCOUNTER — Other Ambulatory Visit (INDEPENDENT_AMBULATORY_CARE_PROVIDER_SITE_OTHER): Payer: Self-pay | Admitting: Primary Care

## 2020-01-05 MED ORDER — METRONIDAZOLE 500 MG PO TABS: 500.0000 mg | ORAL_TABLET | Freq: Two times a day (BID) | ORAL | 0 refills | Status: DC

## 2020-01-05 MED ORDER — FLUCONAZOLE 150 MG PO TABS
150.0000 mg | ORAL_TABLET | Freq: Every day | ORAL | 0 refills | Status: DC
Start: 2020-01-05 — End: 2020-02-03

## 2020-01-07 MED FILL — metroNIDAZOLE 500 MG TABS: 500 | 7 days supply | Qty: 14 | Fill #0

## 2020-01-07 MED FILL — FLUCONAZOLE 150 MG TABLET: 150 | 4 days supply | Qty: 2 | Fill #0

## 2020-01-20 MED FILL — glipiZIDE 5 MG TABS: 5 | 30 days supply | Qty: 60 | Fill #0

## 2020-01-20 MED FILL — LISINOPRIL 5 MG TABLET: 5 | 30 days supply | Qty: 30 | Fill #0

## 2020-01-20 MED FILL — METFORMIN HCL 500 MG TABS: 500 | 30 days supply | Qty: 60 | Fill #0

## 2020-02-03 ENCOUNTER — Other Ambulatory Visit: Payer: Self-pay

## 2020-02-03 ENCOUNTER — Ambulatory Visit (INDEPENDENT_AMBULATORY_CARE_PROVIDER_SITE_OTHER): Payer: Self-pay | Admitting: Primary Care

## 2020-02-03 ENCOUNTER — Encounter (INDEPENDENT_AMBULATORY_CARE_PROVIDER_SITE_OTHER): Payer: Self-pay | Admitting: Primary Care

## 2020-02-03 ENCOUNTER — Encounter (INDEPENDENT_AMBULATORY_CARE_PROVIDER_SITE_OTHER): Payer: Self-pay

## 2020-02-03 ENCOUNTER — Other Ambulatory Visit (INDEPENDENT_AMBULATORY_CARE_PROVIDER_SITE_OTHER): Payer: Self-pay | Admitting: Primary Care

## 2020-02-03 VITALS — BP 115/72 | HR 79 | Temp 97.5°F | Ht 59.5 in | Wt 295.8 lb

## 2020-02-03 DIAGNOSIS — I10 Essential (primary) hypertension: Secondary | ICD-10-CM

## 2020-02-03 DIAGNOSIS — Z23 Encounter for immunization: Secondary | ICD-10-CM

## 2020-02-03 DIAGNOSIS — Z1159 Encounter for screening for other viral diseases: Secondary | ICD-10-CM

## 2020-02-03 DIAGNOSIS — E1165 Type 2 diabetes mellitus with hyperglycemia: Secondary | ICD-10-CM

## 2020-02-03 DIAGNOSIS — E782 Mixed hyperlipidemia: Secondary | ICD-10-CM

## 2020-02-03 DIAGNOSIS — Z9189 Other specified personal risk factors, not elsewhere classified: Secondary | ICD-10-CM

## 2020-02-03 DIAGNOSIS — E119 Type 2 diabetes mellitus without complications: Secondary | ICD-10-CM

## 2020-02-03 LAB — POCT GLYCOSYLATED HEMOGLOBIN (HGB A1C): Hemoglobin A1C: 9 % — AB (ref 4.0–5.6)

## 2020-02-03 LAB — POCT CBG (FASTING - GLUCOSE)-MANUAL ENTRY: Glucose Fasting, POC: 209 mg/dL — AB (ref 70–99)

## 2020-02-03 MED ORDER — LISINOPRIL 5 MG PO TABS: 5.0000 mg | ORAL_TABLET | Freq: Every day | ORAL | 3 refills | Status: DC

## 2020-02-03 MED ORDER — METFORMIN HCL 1000 MG PO TABS: ORAL_TABLET | ORAL | 1 refills | Status: DC

## 2020-02-03 MED ORDER — GLIPIZIDE 10 MG PO TABS: ORAL_TABLET | ORAL | 1 refills | Status: DC

## 2020-02-03 MED FILL — glipiZIDE 10 MG TABS: 10 | 30 days supply | Qty: 60 | Fill #0

## 2020-02-03 MED FILL — metFORMIN HCL 1000 MG TABS: 1000 | 30 days supply | Qty: 60 | Fill #0

## 2020-02-03 NOTE — Progress Notes (Signed)
Established Patient Office Visit  Subjective:  Patient ID: Mackenzie Ford, female    DOB: 1975-12-10  Age: 44 y.o. MRN: [id number]  CC:  Chief Complaint  Patient presents with   Diabetes    HPI Mackenzie Ford is a 44 year old Hispanic female - Spanish speaking only . Interpretor use Florentina Jenny [phone]  presents for management of type 2 diabetes.  She admits to drinking soda-3 daily. Symptoms of polyuria, polyphagia, and polydipsia. Past Medical History:  Diagnosis Date   Hyperlipidemia 2017   Microalbuminuria 02/10/2017   Morbid obesity with body mass index (BMI) of 50.0 to 59.9 in adult [facility]) 2016   Type 2 diabetes mellitus with hyperglycemia, without long-term current use of insulin ([place]) 05/21/2007    Past Surgical History:  Procedure Laterality Date   CESAREAN SECTION     CESAREAN SECTION     CESAREAN SECTION     TUBAL LIGATION  06/28/2007    Family History  Problem Relation Age of Onset   Diabetes Mother    Cancer Mother 34       Uterine   HIV/AIDS Brother    Asthma Son     Social History   Socioeconomic History   Marital status: Married    Spouse name: [name]   Number of children: 3   Years of education: 9   Highest education level: Not on file  Occupational History   Occupation: Housewife  Tobacco Use   Smoking status: Never Smoker   Smokeless tobacco: Never Used  Scientific laboratory technician Use: Never used  Substance and Sexual Activity   Alcohol use: No    Alcohol/week: 0.0 standard drinks   Drug use: No   Sexual activity: Yes    Partners: Male    Birth control/protection: Surgical  Other Topics Concern   Not on file  Social History Narrative   Came to [place]   Lives at home with husband who is patient here, Occupational hygienist, and their 3 children.   Social Determinants of Health   Financial Resource Strain:    Difficulty of Paying Living Expenses: Not on file  Food Insecurity:    Worried About Charity fundraiser in  the Last Year: Not on file   [employer] of Food in the Last Year: Not on file  Transportation Needs:    Lack of Transportation (Medical): Not on file   Lack of Transportation (Non-Medical): Not on file  Physical Activity:    Days of Exercise per Week: Not on file   Minutes of Exercise per Session: Not on file  Stress:    Feeling of Stress : Not on file  Social Connections:    Frequency of Communication with Friends and Family: Not on file   Frequency of Social Gatherings with Friends and Family: Not on file   Attends Religious Services: Not on file   Active Member of Clubs or Organizations: Not on file   Attends Archivist Meetings: Not on file   Marital Status: Not on file  Intimate Partner Violence:    Fear of Current or Ex-Partner: Not on file   Emotionally Abused: Not on file   Physically Abused: Not on file   Sexually Abused: Not on file    Outpatient Medications Prior to Visit  Medication Sig Dispense Refill   atorvastatin (LIPITOR) 20 MG tablet Take 1 tablet (20 mg total) by mouth daily. 90 tablet 1   Blood Glucose Monitoring Suppl (AGAMATRIX PRESTO) w/Device  KIT Check sugars twice daily before meals 1 kit 0   glucose blood (AGAMATRIX PRESTO TEST) test strip Check sugars twice daily before meals 100 each 12   Omega-3 Fatty Acids (FISH OIL) 1200 MG CAPS Take by mouth daily.     glipiZIDE (GLUCOTROL) 5 MG tablet TAKE 1 TABLET BY MOUTH TWICE DAILY BEFORE  A  MEAL. 180 tablet 1   lisinopril (ZESTRIL) 5 MG tablet Take 1 tablet (5 mg total) by mouth daily. 90 tablet 3   metFORMIN (GLUCOPHAGE) 500 MG tablet TAKE 1 TABLET BY MOUTH TWICE DAILY WITH  A  MEAL. 180 tablet 1   fluconazole (DIFLUCAN) 150 MG tablet Take 1 tablet (150 mg total) by mouth daily. May repeat second dose in 2 to 3 days if signs and symptoms remain 2 tablet 0   metroNIDAZOLE (FLAGYL) 500 MG tablet Take 1 tablet (500 mg total) by mouth 2 (two) times daily. 14 tablet 0   No  facility-administered medications prior to visit.    No Known Allergies  ROS Review of Systems  Endocrine: Positive for polydipsia, polyphagia and polyuria.  All other systems reviewed and are negative.     Objective:    BP 115/72 (BP Location: Right Arm, Patient Position: Sitting, Cuff Size: Normal)    Pulse 79    Temp (!) 97.5 F (36.4 C) (Temporal)    Ht 4' 11.5" (1.511 m)    Wt 295 lb 12.8 oz (134.2 kg)    SpO2 93%    BMI 58.74 kg/m  Physical Exam Vitals reviewed.  HENT:     Head: Normocephalic.     Right Ear: There is impacted cerumen.     Left Ear: There is impacted cerumen.     Nose: Nose normal.  Eyes:     Extraocular Movements: Extraocular movements intact.     Pupils: Pupils are equal, round, and reactive to light.  Cardiovascular:     Rate and Rhythm: Normal rate and regular rhythm.  Pulmonary:     Effort: Pulmonary effort is normal.     Breath sounds: Normal breath sounds.  Abdominal:     General: Bowel sounds are normal.  Musculoskeletal:     Cervical back: Normal range of motion and neck supple.  Skin:    General: Skin is warm and dry.  Neurological:     Mental Status: She is alert.  Psychiatric:        Mood and Affect: Mood normal.        Behavior: Behavior normal.        Thought Content: Thought content normal.        Judgment: Judgment normal.     BP 115/72 (BP Location: Right Arm, Patient Position: Sitting, Cuff Size: Normal)    Pulse 79    Temp (!) 97.5 F (36.4 C) (Temporal)    Ht 4' 11.5" (1.511 m)    Wt 295 lb 12.8 oz (134.2 kg)    SpO2 93%    BMI 58.74 kg/m  Wt Readings from Last 3 Encounters:  02/03/20 295 lb 12.8 oz (134.2 kg)  12/31/19 298 lb (135.2 kg)  12/03/19 299 lb 3.2 oz (135.7 kg)     Health Maintenance Due  Topic Date Due   OPHTHALMOLOGY EXAM  05/20/2018    There are no preventive care reminders to display for this patient.  Lab Results  Component Value Date   TSH 2.650 09/03/2019   Lab Results  Component Value  Date   WBC 7.5 02/03/2020  HGB 13.5 02/03/2020   HCT 41.7 02/03/2020   MCV 88 02/03/2020   PLT 334 02/03/2020   Lab Results  Component Value Date   NA 138 02/03/2020   K 4.1 02/03/2020   CO2 25 02/03/2020   GLUCOSE 199 (H) 02/03/2020   BUN 6 02/03/2020   CREATININE 0.53 (L) 02/03/2020   BILITOT 0.3 02/03/2020   ALKPHOS 125 (H) 02/03/2020   AST 187 (H) 02/03/2020   ALT 165 (H) 02/03/2020   PROT 7.3 02/03/2020   ALBUMIN 3.8 02/03/2020   CALCIUM 9.0 02/03/2020   ANIONGAP 13 11/25/2013   Lab Results  Component Value Date   CHOL 191 02/03/2020   Lab Results  Component Value Date   HDL 43 02/03/2020   Lab Results  Component Value Date   LDLCALC 113 (H) 02/03/2020   Lab Results  Component Value Date   TRIG 199 (H) 02/03/2020   Lab Results  Component Value Date   CHOLHDL 4.4 02/03/2020   Lab Results  Component Value Date   HGBA1C 9.0 (A) 02/03/2020      Assessment & Plan:  Aideliz was seen today for diabetes.  Diagnoses and all orders for this visit:  Type 2 diabetes mellitus with hyperglycemia, without long-term current use of insulin ([place]) : Goal of therapy: Less than 6.5 hemoglobin A1c. Continue decreasing foods that are high in carbohydrates are the following rice, potatoes, breads, sugars, and pastas.  Reduction in the intake (eating) will assist in lowering your blood sugars. -     HgB A1c -     Glucose (CBG), Fasting -     metFORMIN (GLUCOPHAGE) 1000 MG tablet; TAKE 1 TABLET BY MOUTH TWICE DAILY WITH  A  MEAL. -     glipiZIDE (GLUCOTROL) 10 MG tablet; TAKE 1 TABLET BY MOUTH TWICE DAILY BEFORE  A  MEAL.   CMP14+EGFR -     CBC with Differential -     Lipid panel  Essential hypertension Renal protection per recommendation of ADA- (ACE/ARBS) -     lisinopril (ZESTRIL) 5 MG tablet; Take 1 tablet (5 mg total) by mouth daily.  Need for immunization against influenza -     Flu Vaccine QUAD 36+ mos IM  Encounter for hepatitis C virus screening test for  high risk patient -     Hepatitis C Antibody  Mixed hyperlipidemia -     Lipid panel   Meds ordered this encounter  Medications   metFORMIN (GLUCOPHAGE) 1000 MG tablet    Sig: TAKE 1 TABLET BY MOUTH TWICE DAILY WITH  A  MEAL.    Dispense:  180 tablet    Refill:  1   glipiZIDE (GLUCOTROL) 10 MG tablet    Sig: TAKE 1 TABLET BY MOUTH TWICE DAILY BEFORE  A  MEAL.    Dispense:  180 tablet    Refill:  1   lisinopril (ZESTRIL) 5 MG tablet    Sig: Take 1 tablet (5 mg total) by mouth daily.    Dispense:  90 tablet    Refill:  3    Follow-up: Return in about 3 months (around 05/04/2020) for T2D in person .    Kerin Perna, NP

## 2020-02-03 NOTE — Patient Instructions (Signed)
Gripe en los adultos Influenza, Adult A la gripe tambin se la conoce como "influenza". Es una infeccin en los pulmones, la nariz y la garganta (vas respiratorias). La causa un virus. La gripe provoca sntomas que son similares a los de un resfro. Tambin causa fiebre alta y dolores corporales. Se transmite fcilmente de persona a persona (es contagiosa). La mejor manera de prevenir la gripe es aplicndose la vacuna contra la gripe todos los aos. Cules son las causas? La causa de esta afeccin es el virus de la influenza. Puede contraer el virus de las siguientes maneras:  Respirar las gotitas que estn en el aire y que provienen de la tos o el estornudo de una persona que tiene el virus.  Tocar algo que tiene el virus (est contaminado) y luego tocarse la boca, la nariz o los ojos. Qu incrementa el riesgo? Hay ciertas cosas que lo pueden hacer ms propenso a tener gripe. Estas incluyen lo siguiente:  No lavarse las manos con frecuencia.  Tener contacto cercano con muchas personas durante la temporada de resfro y gripe.  Tocarse la boca, los ojos o la nariz sin antes lavarse las manos.  No recibir la vacuna antigripal todos los aos. Puede correr un mayor riesgo de tener gripe, junto con problemas graves como una infeccin pulmonar (neumona), si:  Es mayor de 65 aos de edad.  Est embarazada.  Tiene debilitado el sistema que combate las defensas (sistema inmunitario) debido a una enfermedad o porque toma determinados medicamentos.  Tiene una enfermedad prolongada (crnica), por ejemplo: ? Enfermedad cardaca, renal o pulmonar. ? Diabetes. ? Asma.  Tiene un trastorno heptico.  Tiene mucho sobrepeso (obesidad mrbida).  Tiene anemia. Esta es una afeccin que afecta a los glbulos rojos. Cules son los signos o los sntomas? Los sntomas normalmente comienzan de repente y duran entre 4 y 14 das. Pueden incluir los siguientes:  Fiebre y escalofros.  Dolores de  cabeza, dolores en el cuerpo o dolores musculares.  Dolor de garganta.  Tos.  Secrecin o congestin nasal.  Malestar en el pecho.  No desear comer en las cantidades normales (prdida del apetito).  Debilidad o cansancio (fatiga).  Mareos.  Malestar estomacal (nuseas) o ganas de devolver (vmitos). Cmo se trata? Si la gripe se encuentra de forma temprana, se la puede tratar con medicamentos que pueden ayudar a reducir la gravedad de la enfermedad y reducir su duracin (medicamentos antivirales). Estos pueden administrarse por boca (va oral) o por va (catter) intravenosa. Cuidarse en su hogar puede ayudar a que mejoren los sntomas. El mdico puede sugerirle lo siguiente:  Tomar medicamentos de venta libre.  Beber mucho lquido. La gripe suele desaparecer sola. Si tiene sntomas muy graves u otros problemas, puede recibir tratamiento en un hospital. Siga estas indicaciones en su casa:     Actividad  Descanse todo lo que sea necesario. Duerma lo suficiente.  Qudese en su casa y no concurra al trabajo o a la escuela, como se lo haya indicado el mdico. ? No salga de su casa hasta que no haya tenido fiebre por 24horas sin tomar medicamentos. ? Salga de su casa solo para ir al mdico. Comida y bebida  Tome una SRO (solucin de rehidratacin oral). Es una bebida que se vende en farmacias y tiendas.  Beba suficiente lquido para mantener el pis (la orina) de color amarillo plido.  En la medida en que pueda, beba lquidos claros en pequeas cantidades. Los lquidos transparentes son, por ejemplo: ? Agua. ? Trocitos   de hielo. ? Jugo de frutas con agua agregada (jugo de frutas diluido). ? Bebidas deportivas de bajas caloras.  En la medida en que pueda, consuma alimentos blandos y fciles de digerir en pequeas cantidades. Estos alimentos incluyen: ? Bananas. ? Pur de manzana. ? Arroz. ? Carnes magras. ? Tostadas. ? Galletas.  No coma ni beba lo  siguiente: ? Lquidos con alto contenido de azcar o cafena. ? Alcohol. ? Alimentos condimentados o con alto contenido de grasa. Indicaciones generales  Tome los medicamentos de venta libre y los recetados solamente como se lo haya indicado el mdico.  Use un humidificador de aire fro para que el aire de su casa est ms hmedo. Esto puede facilitar la respiracin.  Al toser o estornudar, cbrase la boca y la nariz.  Lvese las manos con agua y jabn frecuentemente, en especial despus de toser o estornudar. Use desinfectante para manos con alcohol si no dispone de agua y jabn.  Concurra a todas las visitas de control como se lo haya indicado el mdico. Esto es importante. Cmo se evita?   Colquese la vacuna antigripal todos los aos. Puede colocarse la vacuna contra la gripe a fines de verano, en otoo o en invierno. Pregntele al mdico cundo debe aplicarse la vacuna contra la gripe.  Evite el contacto con personas que estn enfermas durante el otoo y el invierno (la temporada de resfro y gripe). Comunquese con un mdico si:  Tiene sntomas nuevos.  Tiene los siguientes sntomas: ? Dolor en el pecho. ? Materia fecal lquida (diarrea). ? Fiebre.  La tos empeora.  Empieza a tener ms mucosidad.  Tiene malestar estomacal.  Vomita. Solicite ayuda inmediatamente si:  Le falta el aire.  Tiene dificultad para respirar.  La piel o las uas se ponen de un color azulado.  Presenta dolor muy intenso o rigidez en el cuello.  Tiene dolor de cabeza repentino.  Le duele la cara o el odo de forma repentina.  No puede comer ni beber sin vomitar. Resumen  La gripe es una infeccin en los pulmones, la nariz y la garganta. La causa un virus.  Tome los medicamentos de venta libre y los recetados solamente como se lo haya indicado el mdico.  Aplicarse la vacuna contra la gripe todos los aos es la mejor manera de evitar contagiarse la gripe. Esta informacin no tiene  como fin reemplazar el consejo del mdico. Asegrese de hacerle al mdico cualquier pregunta que tenga. Document Revised: 12/17/2017 Document Reviewed: 12/17/2017 Elsevier Patient Education  2020 [employer]  

## 2020-02-04 LAB — CBC WITH DIFFERENTIAL/PLATELET
Basophils Absolute: 0 10*3/uL (ref 0.0–0.2)
Basos: 1 %
EOS (ABSOLUTE): 0.3 10*3/uL (ref 0.0–0.4)
Eos: 4 %
Hematocrit: 41.7 % (ref 34.0–46.6)
Hemoglobin: 13.5 g/dL (ref 11.1–15.9)
Immature Grans (Abs): 0 10*3/uL (ref 0.0–0.1)
Immature Granulocytes: 0 %
Lymphocytes Absolute: 2.5 10*3/uL (ref 0.7–3.1)
Lymphs: 34 %
MCH: 28.5 pg (ref 26.6–33.0)
MCHC: 32.4 g/dL (ref 31.5–35.7)
MCV: 88 fL (ref 79–97)
Monocytes Absolute: 0.5 10*3/uL (ref 0.1–0.9)
Monocytes: 7 %
Neutrophils Absolute: 4.1 10*3/uL (ref 1.4–7.0)
Neutrophils: 54 %
Platelets: 334 10*3/uL (ref 150–450)
RBC: 4.73 x10E6/uL (ref 3.77–5.28)
RDW: 12.1 % (ref 11.7–15.4)
WBC: 7.5 10*3/uL (ref 3.4–10.8)

## 2020-02-04 LAB — HEPATITIS C ANTIBODY: Hep C Virus Ab: <0.1 s/co ratio (ref 0.0–0.9)

## 2020-02-04 LAB — CMP14+EGFR
ALT: 165 IU/L — ABNORMAL HIGH (ref 0–32)
AST: 187 IU/L — ABNORMAL HIGH (ref 0–40)
Albumin/Globulin Ratio: 1.1 — ABNORMAL LOW (ref 1.2–2.2)
Albumin: 3.8 g/dL (ref 3.8–4.8)
Alkaline Phosphatase: 125 IU/L — ABNORMAL HIGH (ref 44–121)
BUN/Creatinine Ratio: 11 (ref 9–23)
BUN: 6 mg/dL (ref 6–24)
Bilirubin Total: 0.3 mg/dL (ref 0.0–1.2)
CO2: 25 mmol/L (ref 20–29)
Calcium: 9 mg/dL (ref 8.7–10.2)
Chloride: 103 mmol/L (ref 96–106)
Creatinine, Ser: 0.53 mg/dL — ABNORMAL LOW (ref 0.57–1.00)
GFR calc Af Amer: 132 mL/min/{1.73_m2} (ref >59)
GFR calc non Af Amer: 114 mL/min/{1.73_m2} (ref >59)
Globulin, Total: 3.5 g/dL (ref 1.5–4.5)
Glucose: 199 mg/dL — ABNORMAL HIGH (ref 65–99)
Potassium: 4.1 mmol/L (ref 3.5–5.2)
Sodium: 138 mmol/L (ref 134–144)
Total Protein: 7.3 g/dL (ref 6.0–8.5)

## 2020-02-04 LAB — LIPID PANEL
Chol/HDL Ratio: 4.4 ratio (ref 0.0–4.4)
Cholesterol, Total: 191 mg/dL (ref 100–199)
HDL: 43 mg/dL (ref >39)
LDL Chol Calc (NIH): 113 mg/dL — ABNORMAL HIGH (ref 0–99)
Triglycerides: 199 mg/dL — ABNORMAL HIGH (ref 0–149)
VLDL Cholesterol Cal: 35 mg/dL (ref 5–40)

## 2020-02-07 ENCOUNTER — Other Ambulatory Visit (INDEPENDENT_AMBULATORY_CARE_PROVIDER_SITE_OTHER): Payer: Self-pay | Admitting: Primary Care

## 2020-02-07 DIAGNOSIS — E782 Mixed hyperlipidemia: Secondary | ICD-10-CM

## 2020-02-07 MED ORDER — ATORVASTATIN CALCIUM 20 MG PO TABS: 20.0000 mg | ORAL_TABLET | Freq: Every day | ORAL | 1 refills | Status: DC

## 2020-03-07 MED FILL — ATORVASTATIN CALCIUM 20 MG: 20 | 30 days supply | Qty: 30 | Fill #0

## 2020-03-07 MED FILL — LISINOPRIL 5 MG TABLET: 5 | 30 days supply | Qty: 30 | Fill #1

## 2020-03-17 MED FILL — METFORMIN HCL 1000 MG TABS: 1000 | 30 days supply | Qty: 60 | Fill #0

## 2020-03-17 MED FILL — ATORVASTATIN CALCIUM 20 MG: 20 | 30 days supply | Qty: 30 | Fill #0

## 2020-03-17 MED FILL — LISINOPRIL 5 MG TABLET: 5 | 30 days supply | Qty: 30 | Fill #0

## 2020-03-17 MED FILL — glipiZIDE 10 MG TABS: 10 | 30 days supply | Qty: 60 | Fill #0

## 2020-03-29 LAB — HM DIABETES EYE EXAM

## 2020-03-31 ENCOUNTER — Ambulatory Visit: Payer: Self-pay

## 2020-04-17 ENCOUNTER — Ambulatory Visit: Payer: Self-pay

## 2020-04-20 ENCOUNTER — Ambulatory Visit (INDEPENDENT_AMBULATORY_CARE_PROVIDER_SITE_OTHER): Payer: Self-pay | Admitting: Primary Care

## 2020-04-24 ENCOUNTER — Ambulatory Visit: Payer: Self-pay

## 2020-04-24 ENCOUNTER — Telehealth: Payer: Self-pay | Admitting: Primary Care

## 2020-04-24 NOTE — Telephone Encounter (Signed)
mistake

## 2020-05-04 ENCOUNTER — Ambulatory Visit (INDEPENDENT_AMBULATORY_CARE_PROVIDER_SITE_OTHER): Payer: Self-pay | Admitting: Primary Care

## 2020-05-09 MED FILL — METFORMIN HCL 1000 MG TABS: 1000 | 30 days supply | Qty: 60 | Fill #1

## 2020-05-09 MED FILL — LISINOPRIL 5 MG TABLET: 5 | 30 days supply | Qty: 30 | Fill #1

## 2020-05-09 MED FILL — glipiZIDE 10 MG TABS: 10 | 30 days supply | Qty: 60 | Fill #1

## 2020-05-09 MED FILL — ATORVASTATIN CALCIUM 20 MG: 20 | 30 days supply | Qty: 30 | Fill #1

## 2020-06-13 ENCOUNTER — Other Ambulatory Visit (INDEPENDENT_AMBULATORY_CARE_PROVIDER_SITE_OTHER): Payer: Self-pay | Admitting: Primary Care

## 2020-06-13 ENCOUNTER — Ambulatory Visit (INDEPENDENT_AMBULATORY_CARE_PROVIDER_SITE_OTHER): Payer: Self-pay | Admitting: Primary Care

## 2020-06-13 ENCOUNTER — Encounter (INDEPENDENT_AMBULATORY_CARE_PROVIDER_SITE_OTHER): Payer: Self-pay | Admitting: Primary Care

## 2020-06-13 ENCOUNTER — Other Ambulatory Visit: Payer: Self-pay

## 2020-06-13 VITALS — BP 113/74 | HR 75 | Temp 97.3°F | Ht 59.0 in | Wt 283.8 lb

## 2020-06-13 DIAGNOSIS — Z0000 Encounter for general adult medical examination without abnormal findings: Secondary | ICD-10-CM

## 2020-06-13 DIAGNOSIS — E1165 Type 2 diabetes mellitus with hyperglycemia: Secondary | ICD-10-CM

## 2020-06-13 DIAGNOSIS — Z6841 Body Mass Index (BMI) 40.0 and over, adult: Secondary | ICD-10-CM

## 2020-06-13 DIAGNOSIS — I10 Essential (primary) hypertension: Secondary | ICD-10-CM

## 2020-06-13 LAB — POCT GLYCOSYLATED HEMOGLOBIN (HGB A1C): Hemoglobin A1C: 8.9 % — AB (ref 4.0–5.6)

## 2020-06-13 LAB — GLUCOSE, POCT (MANUAL RESULT ENTRY): POC Glucose: 137 mg/dl — AB (ref 70–99)

## 2020-06-13 MED ORDER — TRULICITY 0.75 MG/0.5ML ~~LOC~~ SOAJ: 0.7500 mg | SUBCUTANEOUS | 11 refills | Status: DC

## 2020-06-13 MED ORDER — LISINOPRIL 5 MG PO TABS: 5.0000 mg | ORAL_TABLET | Freq: Every day | ORAL | 3 refills | Status: DC

## 2020-06-13 MED ORDER — GLIPIZIDE 10 MG PO TABS: ORAL_TABLET | ORAL | 1 refills | Status: DC

## 2020-06-13 MED ORDER — METFORMIN HCL 1000 MG PO TABS: ORAL_TABLET | ORAL | 1 refills | Status: DC

## 2020-06-13 MED FILL — ?TRULICITY 0.75 MG/0.5ML PE: 0.75 | 28 days supply | Qty: 2 | Fill #0

## 2020-06-13 MED FILL — METFORMIN HCL 1000 MG TABS: 1000 | 30 days supply | Qty: 60 | Fill #0

## 2020-06-13 MED FILL — glipiZIDE 10 MG TABS: 10 | 30 days supply | Qty: 60 | Fill #0

## 2020-06-13 MED FILL — LISINOPRIL 5 MG TABLET: 5 | 30 days supply | Qty: 30 | Fill #0

## 2020-06-13 NOTE — Patient Instructions (Signed)
Plan de accin para la diabetes mellitus Diabetes Mellitus Action Plan Seguir un plan de accin para la diabetes es una forma de controlar sus sntomas de diabetes (diabetes mellitus). El plan se codifica con colores para ayudarlo a comprender qu acciones necesita tomar en funcin de los sntomas que est teniendo.  Si tiene sntomas pertenecientes a la zona roja, necesita buscar atencin mdica inmediatamente.  Si tiene sntomas pertenecientes a la zona amarilla, est teniendo problemas.  Si tiene sntomas pertenecientes a la zona verde, significa que se Brewing technologist. Aprender y comprender la diabetes puede Radiation protection practitioner. Siga el plan que elabor con el mdico. Conozca el rango deseado para su nivel de azcar en la sangre (glucosa), y revise su plan de tratamiento con su mdico en cada visita. El rango deseado para mi nivel de azcar en la sangre es __________________________ mg/dl. Zona roja Obtenga ayuda de inmediato si observa cualquiera de estos sntomas:  Un resultado de azcar en la sangre que est por debajo de 54 mg/dl (3 mmol/l).  Un nivel de azcar en la sangre mayor o igual que 240mg /dl (13,42mmol/l) durante 2das seguidos.  Confusin o dificultad para pensar con claridad.  Dificultad para respirar.  Malestar o fiebre durante 2 o ms [employer] no mejora.  Niveles moderados o altos de cetonas en la [place].  Sentirse cansado o sin energa. Si tiene cualquiera de los sntomas pertenecientes a la zona roja, no espere para ver si desaparecen. Solicite atencin mdica de inmediato. Comunquese con el servicio de emergencias de su localidad (911 en los [place]). No conduzca por sus propios medios [employer]. Si tiene un nivel de azcar en la sangre muy bajo (hipoglucemia grave) y no puede ingerir ningn alimento ni bebida, tal vez necesite glucagn. Asegrese de que un familiar o amigo sepa controlarle el nivel de azcar en la sangre y aplicarle glucagn. Puede  necesitar tratamiento en un hospital para esta afeccin.   Zona amarilla Si tiene alguno de los siguientes sntomas, su diabetes no est controlada y usted Product manager algunos cambios:  Un nivel de azcar en la sangre mayor o igual que 240mg /dl (13,22mmol/l) durante 2das seguidos.  Un resultado de azcar en la sangre que est por debajo de 70 mg/dl (3,9 mmol/l).  Otros sntomas de hipoglucemia, como: ? Temblores o sensacin de desvanecimiento. ? Confusin o irritabilidad. ? Sensacin de hambre. ? Latidos cardacos acelerados. Si tiene algn sntoma de la zona amarilla:  Trate la hipoglucemia comiendo o bebiendo 15 gramos de hidratos de carbono de accin rpida. Siga las regla 15:15: ? Consuma 15gramos de hidratos de carbono de accin rpida, como:  1pomo de glucosa en gel.  4comprimidos de glucosa.  4onzas (141ml) de jugo de frutas.  4onzas (140ml) de refresco comn (no diettico). ? Controle su nivel de azcar en la sangre 88minutos despus de ingerir el hidrato de carbono. ? Si este nuevo nivel de azcar en la sangre todava es igual o menor que 70mg /dl (3,23mmol/l), ingiera nuevamente 15gramos de un hidrato de carbono. ? Si el nivel de azcar en la sangre no supera los 70mg /dl (3,91mmol/l) despus de 3intentos, solicite ayuda mdica de inmediato. ? Duffy Bruce comida o un refrigerio en el transcurso de 1hora despus de que el nivel de azcar en la sangre se haya normalizado.  Siga tomando los medicamentos diarios como se lo haya indicado el mdico.  Controle su nivel de azcar en la sangre con ms frecuencia que lo hara normalmente. ? [employer]. ?  Llame al mdico si tiene problemas para mantener el nivel de azcar en la sangre dentro del rango deseado.   Zona verde Estos signos significan que se encuentra bien y puede continuar haciendo lo que est haciendo para controlar la diabetes:  Su nivel de azcar en la sangre est en el rango deseado.  En la mayora de las personas, el nivel de azcar en la sangre antes de una comida (preprandial) debera ser de 80 a 130 mg/dl (de 4.4 a 7.2mmol/l).  Se siente bien, y pueda volver a realizar las actividades diarias. Si se encuentra en la zona verde, contine controlando su diabetes como se lo haya indicado el mdico. Para hacer esto:  Siga una dieta saludable.  Haga ejercicio regularmente.  Controle su nivel de azcar en la sangre como se lo haya indicado el mdico.  Tome los medicamentos como se lo haya indicado el mdico.   Dnde buscar ms informacin  American Diabetes Association (ADA) (Asociacin Estadounidense de la Diabetes): diabetes.org  Association of Diabetes Care & Education Specialists (ADCES) (Asociacin de Especialistas en Atencin y Educacin sobre la Diabetes): diabeteseducator.org Resumen  Seguir un plan de accin para la diabetes, es una forma de controlar sus sntomas de diabetes. El plan se codifica con colores para ayudarlo a comprender qu acciones necesita tomar en funcin de los sntomas que est teniendo.  Siga el plan que elabor con el mdico. Asegrese de conocer su nivel deseado de azcar en la sangre.  Revise el plan de tratamiento con el mdico en todas las consultas. Esta informacin no tiene como fin reemplazar el consejo del mdico. Asegrese de hacerle al mdico cualquier pregunta que tenga. Document Revised: 12/16/2019 Document Reviewed: 12/16/2019 Elsevier Patient Education  2021 Elsevier Inc.  

## 2020-06-13 NOTE — Progress Notes (Signed)
Subjective:  Patient ID: Mackenzie Ford, female    DOB: Jul 30, 1975  Age: 45 y.o. MRN: [id number]  CC: Follow-up (diabetes)  Mackenzie Ford [id number] HPI Mackenzie Ford presents forFollow-up of diabetes. Patient does not check blood sugar at home. Compliant with meds - Yes, Exercising regularly? - No, Watching carbohydrate intake? - Yes, Neuropathy ? - No Hypoglycemic events - No. Blood pressure Ford control.Denies shortness of breath, chest pain or lower extremity edema, sudden onset, vision changes, unilateral weakness, dizziness, paresthesias. Mackenzie Ford complaints of a headache that started 3 days away.   Pertinent ROS:  Polyuria - Yes Polydipsia - No Vision problems - No  Medications as noted below. Taking them regularly without complication/adverse reaction being reported today.   History Mackenzie Ford has a past medical history of Hyperlipidemia (2017), Microalbuminuria (02/10/2017), Morbid obesity with body mass index (BMI) of 50.0 to 59.9 in adult Mackenzie Ford) (2016), and Type 2 diabetes mellitus with hyperglycemia, without long-term current use of insulin (Mackenzie Ford) (05/21/2007).   Mackenzie Ford has a past surgical history that includes Cesarean section; Cesarean section; Cesarean section; and Tubal ligation (06/28/2007).   Mackenzie Ford family history includes Asthma in Mackenzie Ford son; Cancer (age of onset: 61) in Mackenzie Ford mother; Diabetes in Mackenzie Ford mother; HIV/AIDS in Mackenzie Ford brother.Mackenzie Ford reports that Mackenzie Ford has never smoked. Mackenzie Ford has never used smokeless tobacco. Mackenzie Ford reports that Mackenzie Ford does not drink alcohol and does not use drugs.  Current Outpatient Medications on File Prior to Visit  Medication Sig Dispense Refill  . atorvastatin (LIPITOR) 20 MG tablet Take 1 tablet (20 mg total) by mouth daily. 90 tablet 1  . Blood Glucose Monitoring Suppl (AGAMATRIX PRESTO) w/Device KIT Check sugars twice daily before meals 1 kit 0  . glucose blood (AGAMATRIX PRESTO TEST) test strip Check sugars twice daily before meals 100 each 12  . Omega-3 Fatty Acids  (FISH OIL) 1200 MG CAPS Take by mouth daily.     No current facility-administered medications on file prior to visit.    ROS Noted In HPI  Objective:  BP 113/74 (BP Location: Right Arm, Patient Position: Sitting, Cuff Size: Large)   Pulse 75   Temp (!) 97.3 F (36.3 C) (Temporal)   Ht $R'4\' 11"'Wa$  (1.499 m)   Wt 283 lb 12.8 oz (128.7 kg)   SpO2 93%   BMI 57.32 kg/m   BP Readings from Last 3 Encounters:  06/13/20 113/74  02/03/20 115/72  12/31/19 117/63    Wt Readings from Last 3 Encounters:  06/13/20 283 lb 12.8 oz (128.7 kg)  02/03/20 295 lb 12.8 oz (134.2 kg)  12/31/19 298 lb (135.2 kg)    Physical Exam Vitals reviewed.  Constitutional:      Appearance: Mackenzie Ford is obese.  HENT:     Right Ear: External ear normal.     Left Ear: External ear normal.  Eyes:     Extraocular Movements: Extraocular movements intact.  Cardiovascular:     Rate and Rhythm: Normal rate and regular rhythm.  Pulmonary:     Effort: Pulmonary effort is normal.     Breath sounds: Normal breath sounds.  Abdominal:     General: Bowel sounds are normal.     Palpations: Abdomen is soft.  Musculoskeletal:        General: Normal range of motion.     Cervical back: Normal range of motion and neck supple.  Skin:    General: Skin is warm and dry.  Neurological:     Mental Status: Mackenzie Ford is alert and oriented  to person, place, and time.  Psychiatric:        Mood and Affect: Mood normal.        Behavior: Behavior normal.        Thought Content: Thought content normal.        Judgment: Judgment normal.     Lab Results  Component Value Date   HGBA1C 8.9 (A) 06/13/2020   HGBA1C 9.0 (A) 02/03/2020   HGBA1C 6.7 (A) 09/03/2019    Lab Results  Component Value Date   WBC 7.5 02/03/2020   HGB 13.5 02/03/2020   HCT 41.7 02/03/2020   PLT 334 02/03/2020   GLUCOSE 199 (H) 02/03/2020   CHOL 191 02/03/2020   TRIG 199 (H) 02/03/2020   HDL 43 02/03/2020   LDLCALC 113 (H) 02/03/2020   ALT 165 (H) 02/03/2020    AST 187 (H) 02/03/2020   NA 138 02/03/2020   K 4.1 02/03/2020   CL 103 02/03/2020   CREATININE 0.53 (L) 02/03/2020   BUN 6 02/03/2020   CO2 25 02/03/2020   TSH 2.650 09/03/2019   HGBA1C 8.9 (A) 06/13/2020     Assessment & Plan:   Mackenzie Ford was seen today for follow-up.  Diagnoses and all orders for this visit:  Mackenzie Ford was seen today for follow-up.  Diagnoses and all orders for this visit:  Annual physical exam  Type 2 diabetes mellitus with hyperglycemia, without long-term current use of insulin (Mackenzie Ford) -     Glucose (CBG) -     HgB A1c 8.9 down from 9.0  We discussed this at last visit again today diabetic retinopathy leading to blindness, diabetic nephropathy leading to dialysis, decrease in circulation decrease in sores or wound healing which may lead to amputations and increase of heart attack and stroke. Last visit agreed on oral medication if not improve we will have to start insulin. Hesitant but agreed.  -     metFORMIN (GLUCOPHAGE) 1000 MG tablet; TAKE 1 TABLET BY MOUTH TWICE DAILY WITH  A  MEAL. -     glipiZIDE (GLUCOTROL) 10 MG tablet; TAKE 1 TABLET BY MOUTH TWICE DAILY BEFORE  A  MEAL. -     Dulaglutide (TRULICITY) 0.76 AU/6.3FH SOPN; Inject 0.75 mg into the skin once a week.  Essential hypertension Renal protection  -     lisinopril (ZESTRIL) 5 MG tablet; Take 1 tablet (5 mg total) by mouth daily.  Morbid obesity with body mass index (BMI) of 50.0 to 59.9 in adult Mackenzie Ford) Class 4 severe obesity due to excess  calories without serious comorbidity with body mass index (BMI) of . 50 in adult Mackenzie Ford) - program of caloric reduction, exercise and behavioral modification recommended      I am having Mackenzie Ford start on Trulicity. I am also having Mackenzie Ford maintain Mackenzie Ford AgaMatrix Presto, glucose blood, Fish Oil, atorvastatin, metFORMIN, glipiZIDE, and lisinopril.  Meds ordered this encounter  Medications  . metFORMIN (GLUCOPHAGE) 1000 MG tablet    Sig: TAKE 1  TABLET BY MOUTH TWICE DAILY WITH  A  MEAL.    Dispense:  180 tablet    Refill:  1  . glipiZIDE (GLUCOTROL) 10 MG tablet    Sig: TAKE 1 TABLET BY MOUTH TWICE DAILY BEFORE  A  MEAL.    Dispense:  180 tablet    Refill:  1  . lisinopril (ZESTRIL) 5 MG tablet    Sig: Take 1 tablet (5 mg total) by mouth daily.    Dispense:  90 tablet  Refill:  3  . Dulaglutide (TRULICITY) 4.78 GN/5.6OZ SOPN    Sig: Inject 0.75 mg into the skin once a week.    Dispense:  0.75 mL    Refill:  11     Follow-up:   Return for [facility] asap teaching for insulin / pcp 3 months.  The above assessment and management plan was discussed with the patient. The patient verbalized understanding of and has agreed to the management plan. Patient is aware to call the clinic if symptoms fail to improve or worsen. Patient is aware when to return to the clinic for a follow-up visit. Patient educated on when it is appropriate to go to the emergency department.   [name], NP-C

## 2020-06-19 ENCOUNTER — Other Ambulatory Visit: Payer: Self-pay

## 2020-06-19 ENCOUNTER — Ambulatory Visit: Payer: Self-pay | Attending: Primary Care | Admitting: Pharmacist

## 2020-06-19 DIAGNOSIS — Z79899 Other long term (current) drug therapy: Secondary | ICD-10-CM

## 2020-06-19 NOTE — Progress Notes (Signed)
Patient was educated on the use of the Trulicity pen. Reviewed necessary supplies and operation of the pen. Also reviewed goal blood glucose levels. Patient was able to demonstrate use. All questions and concerns were addressed.  Patient seen by:  Adam Phenix, PharmD [facility] [employer]  Butch Penny, PharmD, [place], CPP Clinical Pharmacist [facility] [address] [facility] [phone]

## 2020-06-27 ENCOUNTER — Telehealth: Payer: Self-pay | Admitting: Primary Care

## 2020-06-27 NOTE — Telephone Encounter (Signed)
I call Pt to inform that the appt on 07/03/20 was cancel since the provider won't be in the office, Pt need to call back to reschedule

## 2020-07-03 ENCOUNTER — Ambulatory Visit: Payer: Self-pay | Admitting: Pharmacist

## 2020-07-18 MED FILL — ?TRULICITY 0.75 MG/0.5ML PE: 0.75 | 28 days supply | Qty: 2 | Fill #1

## 2020-07-19 MED FILL — ?ATORVASTATIN 20 MG TABLET: 20 | 30 days supply | Qty: 30 | Fill #2

## 2020-07-26 ENCOUNTER — Ambulatory Visit: Payer: Self-pay

## 2020-08-02 ENCOUNTER — Ambulatory Visit: Payer: Self-pay

## 2020-08-04 ENCOUNTER — Ambulatory Visit: Payer: Self-pay | Admitting: Pharmacist

## 2020-08-15 ENCOUNTER — Ambulatory Visit: Payer: Self-pay | Admitting: Pharmacist

## 2020-09-04 ENCOUNTER — Other Ambulatory Visit: Payer: Self-pay

## 2020-09-04 MED FILL — Metformin HCl Tab 1000 MG: ORAL | 30 days supply | Qty: 60 | Fill #0 | Status: CN

## 2020-09-04 MED FILL — Dulaglutide Soln Auto-injector 0.75 MG/0.5ML: SUBCUTANEOUS | 28 days supply | Qty: 2 | Fill #0 | Status: AC

## 2020-09-05 ENCOUNTER — Other Ambulatory Visit: Payer: Self-pay

## 2020-09-11 ENCOUNTER — Encounter (INDEPENDENT_AMBULATORY_CARE_PROVIDER_SITE_OTHER): Payer: Self-pay | Admitting: Primary Care

## 2020-09-11 ENCOUNTER — Ambulatory Visit (INDEPENDENT_AMBULATORY_CARE_PROVIDER_SITE_OTHER): Payer: Self-pay | Admitting: Primary Care

## 2020-09-11 ENCOUNTER — Other Ambulatory Visit: Payer: Self-pay

## 2020-09-11 ENCOUNTER — Ambulatory Visit: Payer: Self-pay | Attending: Primary Care

## 2020-09-11 VITALS — BP 112/69 | HR 69 | Temp 97.3°F | Ht 59.0 in | Wt 277.2 lb

## 2020-09-11 DIAGNOSIS — Z6841 Body Mass Index (BMI) 40.0 and over, adult: Secondary | ICD-10-CM

## 2020-09-11 DIAGNOSIS — Z1211 Encounter for screening for malignant neoplasm of colon: Secondary | ICD-10-CM

## 2020-09-11 DIAGNOSIS — E119 Type 2 diabetes mellitus without complications: Secondary | ICD-10-CM

## 2020-09-11 DIAGNOSIS — E782 Mixed hyperlipidemia: Secondary | ICD-10-CM

## 2020-09-11 LAB — POCT GLYCOSYLATED HEMOGLOBIN (HGB A1C): Hemoglobin A1C: 6.3 % — AB (ref 4.0–5.6)

## 2020-09-11 LAB — GLUCOSE, POCT (MANUAL RESULT ENTRY): POC Glucose: 127 mg/dl — AB (ref 70–99)

## 2020-09-11 NOTE — Patient Instructions (Signed)
Plan de accin para la diabetes mellitus Diabetes Mellitus Action Plan Seguir un plan de accin para la diabetes es una forma de controlar sus sntomas de diabetes (diabetes mellitus). El plan se codifica con colores para ayudarlo a comprender qu acciones necesita tomar en funcin de los sntomas que est teniendo.  Si tiene sntomas pertenecientes a la zona roja, necesita buscar atencin mdica inmediatamente.  Si tiene sntomas pertenecientes a la zona amarilla, est teniendo problemas.  Si tiene sntomas pertenecientes a la zona verde, significa que se Brewing technologist. Aprender y comprender la diabetes puede Radiation protection practitioner. Siga el plan que elabor con el mdico. Conozca el rango deseado para su nivel de azcar en la sangre (glucosa), y revise su plan de tratamiento con su mdico en cada visita. El rango deseado para mi nivel de azcar en la sangre es __________________________ mg/dl. Zona roja Obtenga ayuda de inmediato si observa cualquiera de estos sntomas:  Un resultado de azcar en la sangre que est por debajo de 54 mg/dl (3 mmol/l).  Un nivel de azcar en la sangre mayor o igual que 240mg /dl (13,42mmol/l) durante 2das seguidos.  Confusin o dificultad para pensar con claridad.  Dificultad para respirar.  Malestar o fiebre durante 2 o ms [employer] no mejora.  Niveles moderados o altos de cetonas en la [place].  Sentirse cansado o sin energa. Si tiene cualquiera de los sntomas pertenecientes a la zona roja, no espere para ver si desaparecen. Solicite atencin mdica de inmediato. Comunquese con el servicio de emergencias de su localidad (911 en los [place]). No conduzca por sus propios medios [employer]. Si tiene un nivel de azcar en la sangre muy bajo (hipoglucemia grave) y no puede ingerir ningn alimento ni bebida, tal vez necesite glucagn. Asegrese de que un familiar o amigo sepa controlarle el nivel de azcar en la sangre y aplicarle glucagn. Puede  necesitar tratamiento en un hospital para esta afeccin.   Zona amarilla Si tiene alguno de los siguientes sntomas, su diabetes no est controlada y usted Product manager algunos cambios:  Un nivel de azcar en la sangre mayor o igual que 240mg /dl (13,22mmol/l) durante 2das seguidos.  Un resultado de azcar en la sangre que est por debajo de 70 mg/dl (3,9 mmol/l).  Otros sntomas de hipoglucemia, como: ? Temblores o sensacin de desvanecimiento. ? Confusin o irritabilidad. ? Sensacin de hambre. ? Latidos cardacos acelerados. Si tiene algn sntoma de la zona amarilla:  Trate la hipoglucemia comiendo o bebiendo 15 gramos de hidratos de carbono de accin rpida. Siga las regla 15:15: ? Consuma 15gramos de hidratos de carbono de accin rpida, como:  1pomo de glucosa en gel.  4comprimidos de glucosa.  4onzas (141ml) de jugo de frutas.  4onzas (140ml) de refresco comn (no diettico). ? Controle su nivel de azcar en la sangre 88minutos despus de ingerir el hidrato de carbono. ? Si este nuevo nivel de azcar en la sangre todava es igual o menor que 70mg /dl (3,23mmol/l), ingiera nuevamente 15gramos de un hidrato de carbono. ? Si el nivel de azcar en la sangre no supera los 70mg /dl (3,91mmol/l) despus de 3intentos, solicite ayuda mdica de inmediato. ? Duffy Bruce comida o un refrigerio en el transcurso de 1hora despus de que el nivel de azcar en la sangre se haya normalizado.  Siga tomando los medicamentos diarios como se lo haya indicado el mdico.  Controle su nivel de azcar en la sangre con ms frecuencia que lo hara normalmente. ? [employer]. ?  Llame al mdico si tiene problemas para mantener el nivel de azcar en la sangre dentro del rango deseado.   Zona verde Estos signos significan que se encuentra bien y puede continuar haciendo lo que est haciendo para controlar la diabetes:  Su nivel de azcar en la sangre est en el rango deseado.  En la mayora de las personas, el nivel de azcar en la sangre antes de una comida (preprandial) debera ser de 80 a 130 mg/dl (de 4.4 a 7.2mmol/l).  Se siente bien, y pueda volver a realizar las actividades diarias. Si se encuentra en la zona verde, contine controlando su diabetes como se lo haya indicado el mdico. Para hacer esto:  Siga una dieta saludable.  Haga ejercicio regularmente.  Controle su nivel de azcar en la sangre como se lo haya indicado el mdico.  Tome los medicamentos como se lo haya indicado el mdico.   Dnde buscar ms informacin  American Diabetes Association (ADA) (Asociacin Estadounidense de la Diabetes): diabetes.org  Association of Diabetes Care & Education Specialists (ADCES) (Asociacin de Especialistas en Atencin y Educacin sobre la Diabetes): diabeteseducator.org Resumen  Seguir un plan de accin para la diabetes, es una forma de controlar sus sntomas de diabetes. El plan se codifica con colores para ayudarlo a comprender qu acciones necesita tomar en funcin de los sntomas que est teniendo.  Siga el plan que elabor con el mdico. Asegrese de conocer su nivel deseado de azcar en la sangre.  Revise el plan de tratamiento con el mdico en todas las consultas. Esta informacin no tiene como fin reemplazar el consejo del mdico. Asegrese de hacerle al mdico cualquier pregunta que tenga. Document Revised: 12/16/2019 Document Reviewed: 12/16/2019 Elsevier Patient Education  2021 Elsevier Inc.  

## 2020-09-11 NOTE — Progress Notes (Signed)
Subjective:  Patient ID: Mackenzie Ford, female    DOB: [birth year]  Age: 45 y.o. MRN: [id number]  CC: Diabetes   HPI Ms. 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 Patient does check blood sugar at home  Compliant with meds - Yes Checking CBGs? Yes  Fasting avg - 110-125    Exercising regularly? - Yes Watching carbohydrate intake? - Yes Neuropathy ? - No Hypoglycemic events - No  - Recovers with :   Pertinent ROS:  Polyuria - No Polydipsia - No Vision problems - yes   Medications as noted below. Taking them regularly without complication/adverse reaction being reported today.   History Mackenzie Ford has a past medical history of Hyperlipidemia (2017), Microalbuminuria (02/10/2017), Morbid obesity with body mass index (BMI) of 50.0 to 59.9 in adult [facility]) (2016), and Type 2 diabetes mellitus with hyperglycemia, without long-term current use of insulin ([place]) (05/21/2007).   She has a past surgical history that includes Cesarean section; Cesarean section; Cesarean section; and Tubal ligation (06/28/2007).   Her family history includes Asthma in her son; Cancer (age of onset: 82) in her mother; Diabetes in her mother; HIV/AIDS in her brother.She reports that she has never smoked. She has never used smokeless tobacco. She reports that she does not drink alcohol and does not use drugs.  Current Outpatient Medications on File Prior to Visit  Medication Sig Dispense Refill  . atorvastatin (LIPITOR) 20 MG tablet TAKE 1 TABLET (20 MG TOTAL) BY MOUTH DAILY. 90 tablet 1  . Blood Glucose Monitoring Suppl (AGAMATRIX PRESTO) w/Device KIT Check sugars twice daily before meals 1 kit 0  . Dulaglutide 0.75 MG/0.5ML SOPN INJECT 0.75 MG INTO THE SKIN ONCE A WEEK. 2 mL 11  . glucose blood (AGAMATRIX PRESTO TEST) test strip Check sugars twice daily before meals 100 each 12  . lisinopril (ZESTRIL) 5 MG tablet TAKE 1  TABLET (5 MG TOTAL) BY MOUTH DAILY. 90 tablet 3  . metFORMIN (GLUCOPHAGE) 1000 MG tablet TAKE 1 TABLET BY MOUTH TWICE DAILY WITH A MEAL. 180 tablet 1  . Omega-3 Fatty Acids (FISH OIL) 1200 MG CAPS Take by mouth daily.     No current facility-administered medications on file prior to visit.    ROS Review of Systems  Eyes: Positive for visual disturbance.       Recently had eye exam and glasses changed now to strong BS controlled  All other systems reviewed and are negative.   Objective:  BP 112/69 (BP Location: Right Arm, Patient Position: Sitting, Cuff Size: Large)   Pulse 69   Temp (!) 97.3 F (36.3 C) (Temporal)   Ht _0  (1.499 m)   Wt 277 lb 3.2 oz (125.7 kg)   SpO2 95%   BMI 55.99 kg/m   BP Readings from Last 3 Encounters:  09/11/20 112/69  06/13/20 113/74  02/03/20 115/72    Wt Readings from Last 3 Encounters:  09/11/20 277 lb 3.2 oz (125.7 kg)  06/13/20 283 lb 12.8 oz (128.7 kg)  02/03/20 295 lb 12.8 oz (134.2 kg)    Physical Exam Vitals reviewed.  Constitutional:      Appearance: She is obese.     Comments: morbid  HENT:     Head: Normocephalic.     Right Ear: Tympanic membrane and external ear normal.     Left Ear: Tympanic membrane and external ear normal.     Nose: Nose normal.  Eyes:  Extraocular Movements: Extraocular movements intact.     Pupils: Pupils are equal, round, and reactive to light.  Cardiovascular:     Rate and Rhythm: Normal rate and regular rhythm.  Pulmonary:     Effort: Pulmonary effort is normal.     Breath sounds: Normal breath sounds.  Abdominal:     General: Abdomen is flat. Bowel sounds are normal.     Palpations: Abdomen is soft.  Musculoskeletal:        General: Normal range of motion.     Cervical back: Normal range of motion and neck supple.  Skin:    General: Skin is warm and dry.  Neurological:     Mental Status: She is alert and oriented to person, place, and time.  Psychiatric:        Mood and Affect:  Mood normal.        Behavior: Behavior normal.        Thought Content: Thought content normal.        Judgment: Judgment normal.     Lab Results  Component Value Date   HGBA1C 6.3 (A) 09/11/2020   HGBA1C 8.9 (A) 06/13/2020   HGBA1C 9.0 (A) 02/03/2020    Lab Results  Component Value Date   WBC 7.5 02/03/2020   HGB 13.5 02/03/2020   HCT 41.7 02/03/2020   PLT 334 02/03/2020   GLUCOSE 199 (H) 02/03/2020   CHOL 191 02/03/2020   TRIG 199 (H) 02/03/2020   HDL 43 02/03/2020   LDLCALC 113 (H) 02/03/2020   ALT 165 (H) 02/03/2020   AST 187 (H) 02/03/2020   NA 138 02/03/2020   K 4.1 02/03/2020   CL 103 02/03/2020   CREATININE 0.53 (L) 02/03/2020   BUN 6 02/03/2020   CO2 25 02/03/2020   TSH 2.650 09/03/2019   HGBA1C 6.3 (A) 09/11/2020     Assessment & Plan:    Mackenzie Ford was seen today for diabetes.  Diagnoses and all orders for this visit:  Type 2 diabetes mellitus without complication, without long-term current use of insulin (H[facility])  Goal of therapy: Less than 6.5 hemoglobin A1c.  Has been met. Discontinue glipizide 30m bid .  Continue Trulicity 01.74ml weekly.  Patient stated that since starting Trulicity 1 would later she developed bilateral rashes on her ankles-right ankle approximately size of a quarter rash and left ankle 5 inches rash.  Discussed with clinical pharmacist-advised a small one is normal. Diffuse rashing or hiving would warrant discontinuation. If the rash is not large, encourage to rotate to another site (thigh, arm). As long as there is no trunk hives, throat hives, or airway involvement, I think it would be okay to continue.  Discussed and make sure she was cleared of what a anaphylactic reaction was swelling of the lips facial swelling tongue swelling respiratory problems that would warrant stopping immediately and going to the emergency room. Foods that are high in carbohydrates are the following rice, potatoes, breads, sugars, and pastas.  Reduction in the  intake (eating) will assist in lowering your blood sugars. -     HgB A1c 6.3  -     Glucose (CBG) -     CBC with Differential -     Lipid Panel -     CMP14+EGFR  Morbid obesity with body mass index (BMI) of 50.0 to 59.9 in adult ([facility] She is meeting with her I can refer her today regarding her abdomen one side hangs lower than the right.  It is causing  difficulty to bend over and to walk.  BMI is warranted for bariatric surgery.  Patient advised to call [facility] or other hospital with an range.  Sometimes they offer no charge or discounts if warranted.  Patient will have children to help her research.  Increase exercising as tolerated.  Comprehensive diabetic foot examination, type 2 DM, encounter for [facility]) Completed bilateral rashes ankles will monitor  Colon cancer screening -     Fecal occult blood, imunochemical; Future  Mixed hyperlipidemia Continue to monitor fatty foods in diet, red meat, cheese, milk and increase fiber like whole grains and veggies. -     Lipid Panel   I am having [name] maintain her AgaMatrix Presto, glucose blood, Fish Oil, Dulaglutide, lisinopril, metFORMIN and atorvastatin.  No orders of the defined types were placed in this encounter.    Follow-up:   Return in about 3 months (around 12/11/2020) for DM /weight.  The above assessment and management plan was discussed with the patient. The patient verbalized understanding of and has agreed to the management plan. Patient is aware to call the clinic if symptoms fail to improve or worsen. Patient is aware when to return to the clinic for a follow-up visit. Patient educated on when it is appropriate to go to the emergency department.   [name], NP-C

## 2020-09-12 LAB — LIPID PANEL
Chol/HDL Ratio: 3.9 ratio (ref 0.0–4.4)
Cholesterol, Total: 177 mg/dL (ref 100–199)
HDL: 45 mg/dL (ref >39)
LDL Chol Calc (NIH): 103 mg/dL — ABNORMAL HIGH (ref 0–99)
Triglycerides: 163 mg/dL — ABNORMAL HIGH (ref 0–149)
VLDL Cholesterol Cal: 29 mg/dL (ref 5–40)

## 2020-09-12 LAB — CBC WITH DIFFERENTIAL/PLATELET
Basophils Absolute: 0.1 10*3/uL (ref 0.0–0.2)
Basos: 1 %
EOS (ABSOLUTE): 0.5 10*3/uL — ABNORMAL HIGH (ref 0.0–0.4)
Eos: 5 %
Hematocrit: 39.6 % (ref 34.0–46.6)
Hemoglobin: 12.5 g/dL (ref 11.1–15.9)
Immature Grans (Abs): 0 10*3/uL (ref 0.0–0.1)
Immature Granulocytes: 0 %
Lymphocytes Absolute: 3.7 10*3/uL — ABNORMAL HIGH (ref 0.7–3.1)
Lymphs: 37 %
MCH: 28.9 pg (ref 26.6–33.0)
MCHC: 31.6 g/dL (ref 31.5–35.7)
MCV: 92 fL (ref 79–97)
Monocytes Absolute: 0.6 10*3/uL (ref 0.1–0.9)
Monocytes: 6 %
Neutrophils Absolute: 5.2 10*3/uL (ref 1.4–7.0)
Neutrophils: 51 %
Platelets: 359 10*3/uL (ref 150–450)
RBC: 4.33 x10E6/uL (ref 3.77–5.28)
RDW: 11.9 % (ref 11.7–15.4)
WBC: 10 10*3/uL (ref 3.4–10.8)

## 2020-09-12 LAB — CMP14+EGFR
ALT: 36 IU/L — ABNORMAL HIGH (ref 0–32)
AST: 32 IU/L (ref 0–40)
Albumin/Globulin Ratio: 1.2 (ref 1.2–2.2)
Albumin: 3.8 g/dL (ref 3.8–4.8)
Alkaline Phosphatase: 112 IU/L (ref 44–121)
BUN/Creatinine Ratio: 15 (ref 9–23)
BUN: 9 mg/dL (ref 6–24)
Bilirubin Total: <0.2 mg/dL (ref 0.0–1.2)
CO2: 21 mmol/L (ref 20–29)
Calcium: 8.6 mg/dL — ABNORMAL LOW (ref 8.7–10.2)
Chloride: 105 mmol/L (ref 96–106)
Creatinine, Ser: 0.6 mg/dL (ref 0.57–1.00)
Globulin, Total: 3.1 g/dL (ref 1.5–4.5)
Glucose: 111 mg/dL — ABNORMAL HIGH (ref 65–99)
Potassium: 4.2 mmol/L (ref 3.5–5.2)
Sodium: 142 mmol/L (ref 134–144)
Total Protein: 6.9 g/dL (ref 6.0–8.5)
eGFR: 112 mL/min/{1.73_m2} (ref >59)

## 2020-09-14 ENCOUNTER — Other Ambulatory Visit (INDEPENDENT_AMBULATORY_CARE_PROVIDER_SITE_OTHER): Payer: Self-pay | Admitting: Primary Care

## 2020-09-14 DIAGNOSIS — E782 Mixed hyperlipidemia: Secondary | ICD-10-CM

## 2020-09-14 MED ORDER — ATORVASTATIN CALCIUM 20 MG PO TABS
ORAL_TABLET | Freq: Every day | ORAL | 1 refills | Status: DC
  Filled 2020-09-14 – 2020-09-29 (×2): qty 30, 30d supply, fill #0
  Filled 2020-11-08: qty 30, 30d supply, fill #1

## 2020-09-15 ENCOUNTER — Other Ambulatory Visit: Payer: Self-pay

## 2020-09-20 ENCOUNTER — Other Ambulatory Visit (INDEPENDENT_AMBULATORY_CARE_PROVIDER_SITE_OTHER): Payer: Self-pay

## 2020-09-20 DIAGNOSIS — Z1211 Encounter for screening for malignant neoplasm of colon: Secondary | ICD-10-CM

## 2020-09-21 ENCOUNTER — Telehealth (INDEPENDENT_AMBULATORY_CARE_PROVIDER_SITE_OTHER): Payer: Self-pay

## 2020-09-21 NOTE — Telephone Encounter (Signed)
-----   Message from Grayce Sessions, NP sent at 09/14/2020  9:55 PM EDT ----- Refilled your atorvastin 20mg  start taking at bedtime. Decrease your fatty foods, red meat, cheese, milk and increase fiber like whole grains and veggies.

## 2020-09-21 NOTE — Telephone Encounter (Signed)
Attempted to reach patient with assistance of pacific interpreter [phone]) patient did not answer. Per DPR left voicemail informing patient that atorvastatin has been refilled and to take at bedtime. Reduce milk, cheese, fatty food and red meat; increase veggies and whole grains. Return call to [facility] at [phone] with any questions or concerns. Maryjean Morn, CMA

## 2020-09-22 ENCOUNTER — Other Ambulatory Visit: Payer: Self-pay

## 2020-09-23 LAB — FECAL OCCULT BLOOD, IMMUNOCHEMICAL: Fecal Occult Bld: NEGATIVE

## 2020-09-28 ENCOUNTER — Telehealth (INDEPENDENT_AMBULATORY_CARE_PROVIDER_SITE_OTHER): Payer: Self-pay

## 2020-09-28 NOTE — Telephone Encounter (Signed)
Call placed to patient using pacific interpreter [phone]) patient is aware of negative fecal occult. Maryjean Morn, CMA

## 2020-09-29 ENCOUNTER — Other Ambulatory Visit: Payer: Self-pay

## 2020-09-29 MED FILL — Lisinopril Tab 5 MG: ORAL | 30 days supply | Qty: 30 | Fill #0 | Status: AC

## 2020-10-05 ENCOUNTER — Emergency Department (HOSPITAL_COMMUNITY): Payer: Self-pay

## 2020-10-05 ENCOUNTER — Emergency Department (HOSPITAL_COMMUNITY)
Admission: EM | Admit: 2020-10-05 | Discharge: 2020-10-05 | Disposition: A | Discharge status: Home / Self Care | Payer: Self-pay | Attending: Emergency Medicine | Admitting: Emergency Medicine

## 2020-10-05 DIAGNOSIS — E119 Type 2 diabetes mellitus without complications: Secondary | ICD-10-CM | POA: Insufficient documentation

## 2020-10-05 DIAGNOSIS — S63502A Unspecified sprain of left wrist, initial encounter: Secondary | ICD-10-CM | POA: Insufficient documentation

## 2020-10-05 DIAGNOSIS — Z7984 Long term (current) use of oral hypoglycemic drugs: Secondary | ICD-10-CM | POA: Insufficient documentation

## 2020-10-05 DIAGNOSIS — M25532 Pain in left wrist: Secondary | ICD-10-CM

## 2020-10-05 DIAGNOSIS — W010XXA Fall on same level from slipping, tripping and stumbling without subsequent striking against object, initial encounter: Secondary | ICD-10-CM | POA: Insufficient documentation

## 2020-10-05 DIAGNOSIS — Z79899 Other long term (current) drug therapy: Secondary | ICD-10-CM | POA: Insufficient documentation

## 2020-10-05 NOTE — Discharge Instructions (Signed)
At this time there does not appear to be the presence of an emergent medical condition, however there is always the potential for conditions to change. Please read and follow the below instructions.  Please return to the Emergency Department immediately for any new or worsening symptoms. Please be sure to follow up with your Primary Care Provider within one week regarding your visit today; please call their office to schedule an appointment even if you are feeling better for a follow-up visit. Please call the on-call hand specialist Dr. Roney Mans for further evaluation and treatment of your wrist injury.  Please use the brace to protect your wrist from further injury use rest ice and elevation to help with pain and swelling.  Go to the nearest Emergency Department immediately if: You have fever or chills You lose feeling in your fingers or hand. Your fingers turn white, very red, or cold and blue. You cannot move your fingers. You have any new/concerning or worsening of symptoms  Please read the additional information packets attached to your discharge summary.  Do not take your medicine if  develop an itchy rash, swelling in your mouth or lips, or difficulty breathing; call 911 and seek immediate emergency medical attention if this occurs.  You may review your lab tests and imaging results in their entirety on your MyChart account.  Please discuss all results of fully with your primary care provider and other specialist at your follow-up visit.  Note: Portions of this text may have been transcribed using voice recognition software. Every effort was made to ensure accuracy; however, inadvertent computerized transcription errors may still be present. ------- Below has been translated using Google translate.  Errors may be present.  Interpret with caution.  A continuacin se ha traducido con Soil scientist. Puede haber errores. Interprete con precaucin. ------- En este momento no  parece haber la presencia de una condicin [employer], sin embargo, siempre existe la posibilidad de que las condiciones [place]. Lea y siga las instrucciones a continuacin.  1. Regrese al Departamento de Emergencias de inmediato si presenta sntomas nuevos o que empeoran. 2. Asegrese de hacer un seguimiento con su proveedor de atencin primaria dentro de una semana con respecto a su visita de hoy; llame a su oficina para programar una cita, incluso si se siente mejor para una visita de seguimiento. 3. Llame al especialista en manos de guardia, el Dr. Roney Mans, para una evaluacin y tratamiento adicionales de su lesin en la [place]. Utilice el aparato ortopdico para proteger su mueca de ms lesiones, use hielo de descanso y elevacin para ayudar con el dolor y la hinchazn.  Vaya al Departamento de Emergencias ms cercano de inmediato si: ? Tienes fiebre o escalofros ? Pierde sensibilidad en los dedos o la mano. ? Sus dedos se vuelven blancos, muy rojos o fros y [place]. ? No puedes mover los dedos. ? Tiene algn sntoma nuevo/preocupante o empeoramiento de los sntomas  Lea los paquetes de informacin adicional adjuntos a su resumen de alta.  No tome su medicamento si presenta sarpullido con picazn, hinchazn en la boca o los labios, o dificultad para respirar; llame al 911 y busque atencin mdica de emergencia inmediata si esto ocurre.  Puede revisar sus pruebas de laboratorio y [place] de imgenes en su totalidad en su cuenta MyChart. Analice todos los resultados de forma completa con su proveedor de atencin primaria y otro especialista en su visita de seguimiento.  Nota: Es posible que partes de este texto se hayan  transcrito con Technical sales engineer de reconocimiento de voz. Se hizo todo lo posible para garantizar la precisin; sin embargo, an pueden estar presentes errores de transcripcin computarizados involuntarios.

## 2020-10-05 NOTE — ED Triage Notes (Signed)
Via interpreter # [phone] pt reports Falling and hitting hand on a metal bar yesterday. Pt with pain and swelling to L forearm.

## 2020-10-05 NOTE — ED Provider Notes (Signed)
[facility] EMERGENCY DEPARTMENT Provider Note   CSN: [id number] Arrival date & time: 10/05/20  1443     History Chief Complaint  Patient presents with  . Arm Injury   Spanish video interpreter used during this visit Mackenzie Ford is a 45 y.o. female history of morbid obesity, diabetes, hyperlipidemia.  Patient arrives for evaluation of left wrist injury.  Patient reports yesterday she was carrying a box when she tripped on another box falling forward onto her left wrist.  She reports ulnar-sided left wrist pain moderate intensity aching constant worse with movement and palpation improves with rest.  She feels that her ulnar wrist is also slightly swollen.  Denies fever/chills, head injury, loss conscious, blood thinner use, neck pain, back pain, chest pain, abdominal pain, numbness/tingling, weakness or any additional concerns. HPI     Past Medical History:  Diagnosis Date  . Hyperlipidemia 2017  . Microalbuminuria 02/10/2017  . Morbid obesity with body mass index (BMI) of 50.0 to 59.9 in adult [facility]) 2016  . Type 2 diabetes mellitus with hyperglycemia, without long-term current use of insulin ([place]) 05/21/2007    Patient Active Problem List   Diagnosis Date Noted  . Microalbuminuria 02/10/2017  . Elevated liver enzymes 07/25/2016  . Hyperlipidemia 07/25/2016  . Morbid obesity with body mass index (BMI) of 50.0 to 59.9 in adult [facility]) 05/20/2014  . Morbid obesity ([place]) 01/22/2013  . Type 2 diabetes mellitus with hyperglycemia, without long-term current use of insulin ([place]) 05/21/2007    Past Surgical History:  Procedure Laterality Date  . CESAREAN SECTION    . CESAREAN SECTION    . CESAREAN SECTION    . TUBAL LIGATION  06/28/2007     OB History    Gravida  4   Para  3   Term  3   Preterm  0   AB  1   Living  0     SAB  1   IAB  0   Ectopic  0   Multiple  0   Live Births              Family History  Problem Relation Age  of Onset  . Diabetes Mother   . Cancer Mother 35       Uterine  . HIV/AIDS Brother   . Asthma Son     Social History   Tobacco Use  . Smoking status: Never Smoker  . Smokeless tobacco: Never Used  Vaping Use  . Vaping Use: Never used  Substance Use Topics  . Alcohol use: No    Alcohol/week: 0.0 standard drinks  . Drug use: No    Home Medications Prior to Admission medications   Medication Sig Start Date End Date Taking? Authorizing Provider  atorvastatin (LIPITOR) 20 MG tablet TAKE 1 TABLET (20 MG TOTAL) BY MOUTH DAILY. 09/14/20 09/14/21  Kerin Perna, NP  Blood Glucose Monitoring Suppl [facility] PRESTO) w/Device KIT Check sugars twice daily before meals 06/12/16   Mack Hook, MD  Dulaglutide 0.75 MG/0.5ML SOPN INJECT 0.75 MG INTO THE SKIN ONCE A WEEK. 06/13/20 06/13/21  Kerin Perna, NP  glucose blood (AGAMATRIX PRESTO TEST) test strip Check sugars twice daily before meals 06/12/16   Mack Hook, MD  lisinopril (ZESTRIL) 5 MG tablet TAKE 1 TABLET (5 MG TOTAL) BY MOUTH DAILY. 06/13/20 06/13/21  Kerin Perna, NP  metFORMIN (GLUCOPHAGE) 1000 MG tablet TAKE 1 TABLET BY MOUTH TWICE DAILY WITH A MEAL. 06/13/20 06/13/21  Kerin Perna, NP  Omega-3 Fatty Acids (FISH OIL) 1200 MG CAPS Take by mouth daily.    [provider]    Allergies    Patient has no known allergies.  Review of Systems   Review of Systems  Constitutional: Negative.  Negative for chills and fever.  Cardiovascular: Negative.  Negative for chest pain.  Gastrointestinal: Negative.  Negative for abdominal pain.  Musculoskeletal: Positive for arthralgias. Negative for back pain and neck pain.  Skin: Negative.  Negative for color change.  Neurological: Negative.  Negative for weakness, numbness and headaches.    Physical Exam Updated Vital Signs BP 119/70 (BP Location: Right Arm)   Pulse 69   Temp 98.5 F (36.9 C) (Oral)   Resp 16   SpO2 98%   Physical  Exam Constitutional:      General: She is not in acute distress.    Appearance: Normal appearance. She is well-developed. She is not ill-appearing or diaphoretic.  HENT:     Head: Normocephalic and atraumatic.  Eyes:     General: Vision grossly intact. Gaze aligned appropriately.     Pupils: Pupils are equal, round, and reactive to light.  Neck:     Trachea: Trachea and phonation normal.  Pulmonary:     Effort: Pulmonary effort is normal. No respiratory distress.  Abdominal:     General: There is no distension.     Palpations: Abdomen is soft.     Tenderness: There is no abdominal tenderness. There is no guarding or rebound.  Musculoskeletal:        General: Normal range of motion.     Cervical back: Normal range of motion.     Comments: Left hand: No gross deformities, skin intact. Fingers appear normal.  Tenderness palpation over the TFCC and distal ulna. No snuffbox tenderness to palpation. No tenderness to palpation over flexor sheath.  Finger adduction/abduction intact with 5/5 strength.  Thumb opposition intact.  Decreased range of motion with ulnar deviation flexion/extension at the wrist.  Full strength with resisted range of motion of all movements of the wrist hand and fingers.  Full active and resisted ROM to flexion/extension at wrist, MCP, PIP and DIP of all fingers.  FDS/FDP intact. Grip 5/5 strength.  Radial artery 2+ with <2sec cap refill in all fingers.  Sensation intact to light-tough in median/ulnar/radial distributions.  Full range of motion at the elbow and shoulder without pain.  Skin:    General: Skin is warm and dry.  Neurological:     Mental Status: She is alert.     GCS: GCS eye subscore is 4. GCS verbal subscore is 5. GCS motor subscore is 6.     Comments: Speech is clear and goal oriented, follows commands Major Cranial nerves without deficit, no facial droop Moves extremities without ataxia, coordination intact  Psychiatric:        Behavior: Behavior  normal.     ED Results / Procedures / Treatments   Labs (all labs ordered are listed, but only abnormal results are displayed) Labs Reviewed - No data to display  EKG None  Radiology DG Forearm Left  Result Date: 10/05/2020 CLINICAL DATA:  Fall, pain EXAM: LEFT WRIST - COMPLETE 3+ VIEW; LEFT FOREARM - 2 VIEW COMPARISON:  None. FINDINGS: There is no evidence of fracture or dislocation. There is no evidence of arthropathy or other focal bone abnormality. Soft tissue edema about the medial wrist and distal forearm. IMPRESSION: No fracture or dislocation of the left forearm  or wrist. The carpus is normally aligned. Soft tissue edema about the medial wrist and distal forearm. Electronically Signed   By: Eddie Candle M.D.   On: 10/05/2020 10:39   DG Wrist Complete Left  Result Date: 10/05/2020 CLINICAL DATA:  Fall, pain EXAM: LEFT WRIST - COMPLETE 3+ VIEW; LEFT FOREARM - 2 VIEW COMPARISON:  None. FINDINGS: There is no evidence of fracture or dislocation. There is no evidence of arthropathy or other focal bone abnormality. Soft tissue edema about the medial wrist and distal forearm. IMPRESSION: No fracture or dislocation of the left forearm or wrist. The carpus is normally aligned. Soft tissue edema about the medial wrist and distal forearm. Electronically Signed   By: Eddie Candle M.D.   On: 10/05/2020 10:39    Procedures Procedures   Medications Ordered in ED Medications - No data to display  ED Course  I have reviewed the triage vital signs and the nursing notes.  Pertinent labs & imaging results that were available during my care of the patient were reviewed by me and considered in my medical decision making (see chart for details).    MDM Rules/Calculators/A&P                         Additional history obtained from: 1. Nursing notes from this visit. - History physical examination x-rays today consistent with left wrist sprain likely TFCC.  No evidence for cellulitis, septic  arthritis, DVT, compartment syndrome, neurovascular compromise or other emergent pathologies.  No evidence of injury of the elbow or shoulder.  Patient placed in Velcro wrist brace.  Encouraged rice therapy.  Patient given referral to on-call hand specialist Dr. Jeannie Fend for a follow-up appointment.  Patient courage to call their office today to schedule an appointment and return to the ER for any new or worsening symptoms.  Patient denies any head injury loss conscious blood thinner use or any additional injuries or concerns at this time.   At this time there does not appear to be any evidence of an acute emergency medical condition and the patient appears stable for discharge with appropriate outpatient follow up. Diagnosis was discussed with patient who verbalizes understanding of care plan and is agreeable to discharge. I have discussed return precautions with patient who verbalizes understanding. Patient encouraged to follow-up with their PCP and hand specialist. All questions answered.   Spanish video interpreter used throughout this visit.  Note: Portions of this report may have been transcribed using voice recognition software. Every effort was made to ensure accuracy; however, inadvertent computerized transcription errors may still be present.  Final Clinical Impression(s) / ED Diagnoses Final diagnoses:  Left wrist pain  Sprain of left wrist, initial encounter    Rx / DC Orders ED Discharge Orders    None       Gari Crown 10/05/20 1117    Dorie Rank, MD 10/06/20 [id number]

## 2020-10-23 ENCOUNTER — Encounter (INDEPENDENT_AMBULATORY_CARE_PROVIDER_SITE_OTHER): Payer: Self-pay | Admitting: Primary Care

## 2020-10-23 ENCOUNTER — Ambulatory Visit (INDEPENDENT_AMBULATORY_CARE_PROVIDER_SITE_OTHER): Payer: Self-pay | Admitting: Primary Care

## 2020-10-23 ENCOUNTER — Other Ambulatory Visit: Payer: Self-pay

## 2020-10-23 VITALS — BP 134/82 | HR 71 | Temp 97.2°F | Ht 59.0 in | Wt 275.4 lb

## 2020-10-23 DIAGNOSIS — Z6841 Body Mass Index (BMI) 40.0 and over, adult: Secondary | ICD-10-CM

## 2020-10-23 DIAGNOSIS — M25532 Pain in left wrist: Secondary | ICD-10-CM

## 2020-10-23 DIAGNOSIS — Z09 Encounter for follow-up examination after completed treatment for conditions other than malignant neoplasm: Secondary | ICD-10-CM

## 2020-10-23 DIAGNOSIS — M25559 Pain in unspecified hip: Secondary | ICD-10-CM

## 2020-10-23 MED ORDER — IBUPROFEN 800 MG PO TABS
800.0000 mg | ORAL_TABLET | Freq: Three times a day (TID) | ORAL | 1 refills | Status: DC | PRN
  Filled 2020-10-23: qty 90, 30d supply, fill #0

## 2020-10-23 NOTE — Progress Notes (Signed)
Pain begins at hip and radiates down leg states it lasts for about 3/4 days goes away then returns States there is a ball on left hip

## 2020-10-23 NOTE — Patient Instructions (Signed)
Recuento de calorías para bajar de peso  Calorie Counting for Weight Loss  Las calorías son unidades de energía. El cuerpo necesita una cierta cantidad de calorías de los alimentos para que lo ayuden a funcionar durante todo el día. Cuando se comen o beben más calorías de las que el cuerpo necesita, este acumula las calorías adicionales mayormente como grasa. Cuando se comen o beben menos calorías de las que el cuerpo necesita, este quema grasa para obtener la energía que necesita.  El recuento de calorías es el registro de la cantidad de calorías que se comen y beben cada día. El recuento de calorías puede ser de ayuda si necesita perder peso. Si come menos calorías de las que el cuerpo necesita, debería bajar de peso. Pregúntele al médico cuál es un peso sano para usted.  Para que el recuento de calorías funcione, usted tendrá que ingerir la cantidad de calorías adecuadas cada día, para bajar una cantidad de peso saludable por semana. Un nutricionista puede ayudar a determinar la cantidad de calorías que usted necesita por día y sugerirle formas de alcanzar su objetivo calórico.  · Una cantidad de peso saludable para bajar cada semana suele ser entre 1 y 2 libras (0.5 a 0.9 kg). Esto habitualmente significa que su ingesta diaria de calorías se debería reducir en unas 500 a 750 calorías.  · Ingerir de 1200 a 1500 calorías por día puede ayudar a la mayoría de las mujeres a bajar de peso.  · Ingerir de 1500 a 1800 calorías por día puede ayudar a la mayoría de los hombres a bajar de peso.  ¿Qué debo saber acerca del recuento de calorías?  Trabaje con el médico o el nutricionista para determinar cuántas calorías debe recibir cada día. A fin de alcanzar su objetivo diario de calorías, tendrá que:  · Averiguar cuántas calorías hay en cada alimento que le gustaría comer. Intente hacerlo antes de comer.  · Decidir la cantidad que puede comer del alimento.  · Llevar un registro de los alimentos. Para esto, anote lo que  comió y cuántas calorías tenía.  Para perder peso con éxito, es importante equilibrar el recuento de calorías con un estilo de vida saludable que incluya actividad física de forma regular.  ¿Dónde encuentro información sobre las calorías?    Es posible encontrar la cantidad de calorías que contiene un alimento en la etiqueta de información nutricional. Si un alimento no tiene una etiqueta de información nutricional, intente buscar las calorías en Internet o pida ayuda al nutricionista.  Recuerde que las calorías se calculan por porción. Si opta por comer más de una porción de un alimento, tendrá que multiplicar las calorías de una porción por la cantidad de porciones que planea comer. Por ejemplo, la etiqueta de un envase de pan puede decir que el tamaño de una porción es 1 rodaja, y que una porción tiene 90 calorías. Si come 1 rodaja, habrá comido 90 calorías. Si come 2 rodajas, habrá comido 180 calorías.  ¿Cómo llevo un registro de comidas?  Después de cada vez que coma, anote lo siguiente en el registro de alimentos lo antes posible:  · Lo que comió. Asegúrese de incluir los aderezos, las salsas y otros extras en los alimentos.  · La cantidad que comió. Esto se puede medir en tazas, onzas o cantidad de alimentos.  · Cuántas calorías había en cada alimento y en cada bebida.  · La cantidad total de calorías en la comida que tomó.  Tenga a mano el registro de alimentos, por ejemplo, en un anotador de bolsillo o   utilice una aplicación o sitio web en el teléfono móvil. Algunos programas calcularán las calorías por usted y le mostrarán la cantidad de calorías que le quedan para llegar al objetivo diario.  ¿Cuáles son algunos consejos para controlar las porciones?  · Sepa cuántas calorías hay en una porción. Esto lo ayudará a saber cuántas porciones de un alimento determinado puede comer.  · Use una taza medidora para medir los tamaños de las porciones. También puede intentar pesar las porciones en una balanza de  cocina. Con el tiempo, podrá hacer un cálculo estimativo de los tamaños de las porciones de algunos alimentos.  · Dedique tiempo a poner porciones de diferentes alimentos en sus platos, tazones y tazas predilectos, a fin de saber cómo se ve una porción.  · Intente no comer directamente de un envase de alimentos, por ejemplo, de una bolsa o una caja. Comer directamente del envase dificulta ver cuánto está comiendo y puede conducir a comer en exceso. Ponga la cantidad que le gustaría comer en una taza o un plato, a fin de asegurarse de que está comiendo la porción correcta.  · Use platos, vasos y tazones más pequeños para medir porciones más pequeñas y evitar no comer en exceso.  · Intente no realizar varias tareas al mismo tiempo. Por ejemplo, evite mirar televisión o usar la computadora mientras come. Si es la hora de comer, siéntese a la mesa y disfrute de la comida. Esto lo ayudará a reconocer cuándo está satisfecho. También le permitirá estar más consciente de qué come y cuánto come.  Consejos para seguir este plan  Al leer las etiquetas de los alimentos  · Controle el recuento de calorías en comparación con el tamaño de la porción. El tamaño de la porción puede ser más pequeño de lo que suele comer.  · Verifique la fuente de las calorías. Intente elegir alimentos ricos en proteínas, fibras y vitaminas, y bajos en grasas saturadas, grasas trans y sodio.  Al ir de compras  · Lea las etiquetas nutricionales cuando compre. Esto lo ayudará a tomar decisiones saludables sobre qué alimentos comprar.  · Preste atención a las etiquetas nutricionales de alimentos bajos en grasas o sin grasas. Estos alimentos a veces tienen la misma cantidad de calorías o más calorías que las versiones ricas en grasas. Con frecuencia, también tienen agregados de azúcar, almidón o sal, para darles el sabor que fue eliminado con las grasas.  · Haga una lista de compras con los alimentos que tienen un menor contenido de calorías y  respétela.  Al cocinar  · Intente cocinar sus alimentos preferidos de una manera más saludable. Por ejemplo, pruebe hornear en vez de freír.  · Utilice productos lácteos descremados.  Planificación de las comidas  · Utilice más frutas y verduras. La mitad de su plato debe ser de frutas y verduras.  · Incluya proteínas magras, como pollo, pavo y pescado.  Estilo de vida  Cada semana, trate de hacer una de las siguientes cosas:  · 150 minutos de ejercicio moderado, como caminar.  · 75 minutos de ejercicio enérgico, como correr.  Información general  · Sepa cuántas calorías tienen los alimentos que come con más frecuencia. Esto le ayudará a contar las calorías más rápidamente.  · Encuentre un método para controlar las calorías que funcione para usted. Sea creativo. Pruebe aplicaciones o programas distintos, si llevar un registro de las calorías no funciona para usted.  ¿Qué alimentos debo consumir?    · Consuma alimentos nutritivos. Es mejor comer un alimento   nutritivo, de alto contenido calórico, como un aguacate, que uno con pocos nutrientes, como una bolsa de patatas fritas.  · Use sus calorías en alimentos y bebidas que lo sacien y no lo dejen con apetito apenas termina de comer.  ? Ejemplos de alimentos que lo sacian son los frutos secos y mantequillas de frutos secos, verduras, proteínas magras y alimentos con alto contenido de fibra como los cereales integrales. Los alimentos con alto contenido de fibra son aquellos que tienen más de 5 g de fibra por porción.  · Preste atención a las calorías en las bebidas. Las bebidas de bajas calorías incluyen agua y refrescos sin azúcar.  Es posible que los productos que se enumeran más arriba no constituyan una lista completa de los alimentos y las bebidas que puede tomar. Consulte a un nutricionista para obtener más información.  ¿Qué alimentos debo limitar?  Limite el consumo de alimentos o bebidas que no sean buenas fuentes de vitaminas, minerales o proteínas, o que  tengan alto contenido de grasas no saludables. Estos incluyen:  · Caramelos.  · Otros dulces.  · Refrescos, bebidas con café especiales, alcohol y jugo.  Es posible que los productos que se enumeran más arriba no constituyan una lista completa de los alimentos y las bebidas que debe evitar. Consulte a un nutricionista para obtener más información.  ¿Cómo puedo hacer el recuento de calorías cuando como afuera?  · Preste atención a las porciones. A menudo, las porciones son mucho más grandes al comer afuera. Pruebe con estos consejos para mantener las porciones más pequeñas:  ? Considere la posibilidad de compartir una comida en lugar de tomarla toda usted solo.  ? Si pide su propia comida, coma solo la mitad. Antes de empezar a comer, pida un recipiente y ponga la mitad de la comida en él.  ? Cuando sea posible, considere la posibilidad de pedir porciones más pequeñas del menú en lugar de porciones completas.  · Preste atención a la elección de alimentos y bebidas. Saber la forma en que se cocinan los alimentos y lo que incluye la comida puede ayudarlo a ingerir menos calorías.  ? Si se detallan las calorías en el menú, elija las opciones que contengan la menor cantidad.  ? Elija platos que incluyan verduras, frutas, cereales integrales, productos lácteos con bajo contenido de grasa y proteínas magras.  ? Opte por los alimentos hervidos, asados, cocidos a la parrilla o al vapor. Evite los alimentos a los que se les ponga mantequilla, que estén empanados o fritos, o que se sirvan con salsa a base de crema. Generalmente, los alimentos que se etiquetan como “crujientes” están fritos, a menos que se indique lo contrario.  ? Elija el agua, la leche descremada, el té helado sin azúcar u otras bebidas que no contengan azúcares agregados. Si desea una bebida alcohólica, escoja una opción con menos calorías, como una copa de vino o una cerveza ligera.  ? Ordene los aderezos, las salsas y los jarabes aparte. Estos son, con  frecuencia, de alto contenido en calorías, por lo que debe limitar la cantidad que ingiere.  ? Si desea una ensalada, elija una de hortalizas y pida carnes a la parrilla. Evite las guarniciones adicionales como el tocino, el queso o los alimentos fritos. Ordene el aderezo aparte o pida aceite de oliva y vinagre o limón para aderezar.  · Haga un cálculo estimativo de la cantidad de porciones que le sirven. Conocer el tamaño de las porciones lo ayudará a estar atento a   la cantidad de comida que come en los restaurantes.  Dónde buscar más información  · Centers for Disease Control and Prevention (Centros para el Control y la Prevención de Enfermedades): www.cdc.gov  · [place] Department of Agriculture (Departamento de Agricultura de los [place]): myplate.gov  Resumen  · El recuento de calorías es el registro de la cantidad de calorías que se comen y beben cada día. Si come menos calorías de las que el cuerpo necesita, debería bajar de peso.  · Una cantidad de peso saludable para bajar por semana suele ser entre 1 y 2 libras (0.5 a 0.9 kg). Esto significa, con frecuencia, reducir su ingesta diaria de calorías unas 500 a 750 calorías.  · Es posible encontrar la cantidad de calorías que contiene un alimento en la etiqueta de información nutricional. Si un alimento no tiene una etiqueta de información nutricional, intente buscar las calorías en Internet o pida ayuda al nutricionista.  · Use platos, vasos y tazones más pequeños para medir porciones más pequeñas y evitar no comer en exceso.  · Use sus calorías en alimentos y bebidas que lo sacien y no lo dejen con apetito poco tiempo después de haber comido.  Esta información no tiene como fin reemplazar el consejo del médico. Asegúrese de hacerle al médico cualquier pregunta que tenga.  Document Revised: 08/30/2019 Document Reviewed: 08/30/2019  Elsevier Patient Education © 2021 Elsevier Inc.

## 2020-10-23 NOTE — Progress Notes (Signed)
Established Patient Office Visit  Subjective:  Patient ID: Mackenzie Ford, female    DOB: [birth year]  Age: 45 y.o. MRN: [id number]  CC:  Chief Complaint  Patient presents with  . Leg Pain    Left leg     HPI Mackenzie Ford  Is a  45 y.o. female Hispanic female ( interpretor Mackenzie Ford [id number]) presents for hip pain that radiates down her left leg.(8/10)  She recently fell and seen in the ED Mackenzie Ford on 10/05/20  for evaluation of left wrist injury.    Past Medical History:  Diagnosis Date  . Hyperlipidemia 2017  . Microalbuminuria 02/10/2017  . Morbid obesity with body mass index (BMI) of 50.0 to 59.9 in adult [facility]) 2016  . Type 2 diabetes mellitus with hyperglycemia, without long-term current use of insulin ([facility]) 05/21/2007    Past Surgical History:  Procedure Laterality Date  . CESAREAN SECTION    . CESAREAN SECTION    . CESAREAN SECTION    . TUBAL LIGATION  06/28/2007    Family History  Problem Relation Age of Onset  . Diabetes Mother   . Cancer Mother 35       Uterine  . HIV/AIDS Brother   . Asthma Son     Social History   Socioeconomic History  . Marital status: Married    Spouse name: Mackenzie Ford  . Number of children: 3  . Years of education: 9  . Highest education level: Not on file  Occupational History  . Occupation: Housewife  Tobacco Use  . Smoking status: Never Smoker  . Smokeless tobacco: Never Used  Vaping Use  . Vaping Use: Never used  Substance and Sexual Activity  . Alcohol use: No    Alcohol/week: 0.0 standard drinks  . Drug use: No  . Sexual activity: Yes    Partners: Male    Birth control/protection: Surgical  Other Topics Concern  . Not on file  Social History Narrative   Came to [place]   Lives at home with husband who is patient here, Mackenzie Ford, and their 3 children.   Social Determinants of Health   Financial Resource Strain: Not on file  Food Insecurity: Not on file  Transportation Needs: Not on file  Physical Activity: Not  on file  Stress: Not on file  Social Connections: Not on file  Intimate Partner Violence: Not on file    Outpatient Medications Prior to Visit  Medication Sig Dispense Refill  . atorvastatin (LIPITOR) 20 MG tablet TAKE 1 TABLET (20 MG TOTAL) BY MOUTH DAILY. 90 tablet 1  . Blood Glucose Monitoring Suppl (AGAMATRIX PRESTO) w/Device KIT Check sugars twice daily before meals 1 kit 0  . Dulaglutide 0.75 MG/0.5ML SOPN INJECT 0.75 MG INTO THE SKIN ONCE A WEEK. 2 mL 11  . glucose blood (AGAMATRIX PRESTO TEST) test strip Check sugars twice daily before meals 100 each 12  . lisinopril (ZESTRIL) 5 MG tablet TAKE 1 TABLET (5 MG TOTAL) BY MOUTH DAILY. 90 tablet 3  . metFORMIN (GLUCOPHAGE) 1000 MG tablet TAKE 1 TABLET BY MOUTH TWICE DAILY WITH A MEAL. 180 tablet 1  . Omega-3 Fatty Acids (FISH OIL) 1200 MG CAPS Take by mouth daily.     No facility-administered medications prior to visit.    No Known Allergies  ROS Review of Systems  Musculoskeletal:       Hip pain and wrist pain   All other systems reviewed and are negative.     Objective:  Physical Exam Vitals reviewed.  Constitutional:      Appearance: She is obese.     Comments: Morbid Body mass index is 55.62 kg/m.  HENT:     Head: Normocephalic.     Right Ear: External ear normal.     Left Ear: External ear normal.     Nose: Nose normal.  Eyes:     Extraocular Movements: Extraocular movements intact.  Cardiovascular:     Rate and Rhythm: Normal rate and regular rhythm.  Pulmonary:     Effort: Pulmonary effort is normal.     Breath sounds: Normal breath sounds.  Abdominal:     General: Bowel sounds are normal. There is distension.     Palpations: Abdomen is soft.  Musculoskeletal:        General: Normal range of motion.     Cervical back: Normal range of motion and neck supple.  Skin:    General: Skin is warm and dry.  Neurological:     Mental Status: She is alert and oriented to person, place, and time.   Psychiatric:        Mood and Affect: Mood normal.        Behavior: Behavior normal.        Thought Content: Thought content normal.        Judgment: Judgment normal.     BP 134/82 (BP Location: Right Arm, Patient Position: Sitting, Cuff Size: Large)   Pulse 71   Temp (!) 97.2 F (36.2 C) (Temporal)   Ht 4' 11" (1.499 m)   Wt 275 lb 6.4 oz (124.9 kg)   SpO2 96%   BMI 55.62 kg/m  Wt Readings from Last 3 Encounters:  10/23/20 275 lb 6.4 oz (124.9 kg)  09/11/20 277 lb 3.2 oz (125.7 kg)  06/13/20 283 lb 12.8 oz (128.7 kg)     Health Maintenance Due  Topic Date Due  . Pneumococcal Vaccine 22-38 Years old (1 of 4 - PCV13) Never done  . COLONOSCOPY (Pts 45-23yr Insurance coverage will need to be confirmed)  Never done  . COVID-19 Vaccine (3 - Booster for Pfizer series) 04/16/2020  . PAP SMEAR-Modifier  12/02/2020    There are no preventive care reminders to display for this patient.  Lab Results  Component Value Date   TSH 2.650 09/03/2019   Lab Results  Component Value Date   WBC 10.0 09/11/2020   HGB 12.5 09/11/2020   HCT 39.6 09/11/2020   MCV 92 09/11/2020   PLT 359 09/11/2020   Lab Results  Component Value Date   NA 142 09/11/2020   K 4.2 09/11/2020   CO2 21 09/11/2020   GLUCOSE 111 (H) 09/11/2020   BUN 9 09/11/2020   CREATININE 0.60 09/11/2020   BILITOT <0.2 09/11/2020   ALKPHOS 112 09/11/2020   AST 32 09/11/2020   ALT 36 (H) 09/11/2020   PROT 6.9 09/11/2020   ALBUMIN 3.8 09/11/2020   CALCIUM 8.6 (L) 09/11/2020   ANIONGAP 13 11/25/2013   EGFR 112 09/11/2020   Lab Results  Component Value Date   CHOL 177 09/11/2020   Lab Results  Component Value Date   HDL 45 09/11/2020   Lab Results  Component Value Date   LDLCALC 103 (H) 09/11/2020   Lab Results  Component Value Date   TRIG 163 (H) 09/11/2020   Lab Results  Component Value Date   CHOLHDL 3.9 09/11/2020   Lab Results  Component Value Date   HGBA1C 6.3 (A) 09/11/2020  Assessment & Plan:  Mackenzie Ford was seen today for leg pain.  Diagnoses and all orders for this visit:  Left wrist pain X-ray from ED showed Soft tissue edema about the medial wrist and distal forearm. She was given a wrist brace not wearing at this time has more movement but pain present. ROM increased pain showed how to do ROM to avoid stiffness and wear wrist brace.  Hip pain Work on losing weight to help reduce joint pain. May alternate with heat and ice application for pain relief. May also alternate with acetaminophen and Ibuprofen as prescribed pain relief. Other alternatives include massage, acupuncture and water aerobics.  You must stay active and avoid a sedentary lifestyle. Pain radiates down leg denies bowel/bladder incontinence.   Hospital discharge follow-up ED follow up s/p fall with injuries to wrist and hip- continues to have tenderness on left wrist and left hip   Morbid obesity with body mass index (BMI) of 50.0 to 59.9 in adult ([facility]) Discussed weight loss again advised patient if Bp continues to be wnl and she looses 10 lbs in 3 months we will discuss an appetite suppressant .  Follow-up: No follow-ups on file.    Michelle P Edwards, NP 

## 2020-10-30 ENCOUNTER — Other Ambulatory Visit: Payer: Self-pay

## 2020-10-30 ENCOUNTER — Emergency Department (HOSPITAL_COMMUNITY)
Admission: EM | Admit: 2020-10-30 | Discharge: 2020-10-30 | Disposition: A | Discharge status: Home / Self Care | Payer: Self-pay | Attending: Emergency Medicine | Admitting: Emergency Medicine

## 2020-10-30 ENCOUNTER — Encounter (HOSPITAL_COMMUNITY): Payer: Self-pay | Admitting: *Deleted

## 2020-10-30 DIAGNOSIS — E119 Type 2 diabetes mellitus without complications: Secondary | ICD-10-CM | POA: Insufficient documentation

## 2020-10-30 DIAGNOSIS — L239 Allergic contact dermatitis, unspecified cause: Secondary | ICD-10-CM | POA: Insufficient documentation

## 2020-10-30 DIAGNOSIS — Z794 Long term (current) use of insulin: Secondary | ICD-10-CM | POA: Insufficient documentation

## 2020-10-30 DIAGNOSIS — R21 Rash and other nonspecific skin eruption: Secondary | ICD-10-CM

## 2020-10-30 DIAGNOSIS — Z7984 Long term (current) use of oral hypoglycemic drugs: Secondary | ICD-10-CM | POA: Insufficient documentation

## 2020-10-30 MED ORDER — DIPHENHYDRAMINE HCL 25 MG PO CAPS
50.0000 mg | ORAL_CAPSULE | Freq: Once | ORAL | Status: AC
  Administered 2020-10-30: 50 mg via ORAL
  Filled 2020-10-30: qty 2

## 2020-10-30 NOTE — ED Triage Notes (Signed)
Used interpretor, pt reported working in yard and then has rash and itching to her arm, neck and face. Airway intact.

## 2020-10-30 NOTE — Discharge Instructions (Addendum)
You were seen in the ER today for your itchy rash on your face and your right shoulder.  Your rash is most consistent with contact dermatitis from your garden, possibly related to poison ivy though there is no way to tell definitively which plant caused your reaction.  Fortunately, your reaction does not appear to be affecting your airway or your breathing at all.  Please take Benadryl, 25 to 50 mg every 6 hours as needed for the next few days until your rash improves.  After that you may begin taking an allergy medication called Zyrtec.  You may also use topical treatment such as calamine lotion, oatmeal baths, and cool compresses to help with your itching.  Please call your primary care doctor and schedule appointment for follow-up next week.  Return to the ER if you develop any chest tightness shortness of breath, swelling of your lips, tongue, or throat, or any other new severe symptoms.  Lo vieron en la sala de emergencias hoy por su sarpullido con picazn en la cara y el hombro derecho. Su erupcin es ms consistente con la dermatitis de contacto de su jardn, posiblemente relacionada con la hiedra venenosa, aunque no hay forma de saber definitivamente qu planta caus su reaccin.  Afortunadamente, su reaccin no parece estar afectando sus vas respiratorias ni su respiracin en absoluto. Tome Benadryl, 25 a 50 mg cada 6 horas segn sea necesario durante los prximos [address] que mejore su sarpullido. Despus de eso, puede comenzar a tomar un medicamento para la alergia llamado Zyrtec. [employer] usar un tratamiento tpico como locin de calamina, baos de avena y compresas fras para ayudar con la picazn.  Llame a su mdico de atencin primaria y programe una cita para el seguimiento la prxima semana.  Regrese a la sala de emergencias si presenta opresin en el pecho, dificultad para respirar, hinchazn de los labios, la lengua o la garganta, o cualquier otro sntoma grave nuevo.

## 2020-10-30 NOTE — ED Provider Notes (Signed)
[facility] EM[facility]GENC[facility] DEPARTMENT Provider Note   CSN: [id number] Arrival date & time: 10/30/20  1935     History Chief Complaint  Patient presents with   Rash   History provided by the patient with assistance of Spanish interpreter number [phone].  Mackenzie Ford is a 45 y.o. female who presents with erythematous itchy rash to the right face and right neck and shoulder after gardening yesterday.  She states that she began to experience itching and burning sensation in the right face yesterday evening after working in her garden that afternoon.  She states the rash is spread to her right neck and right shoulder.  She denies any numbness, tingling, swelling in her lips, tongue, or throat, denies any difficulty swallowing, shortness of breath, chest pain, palpitations.  Denies any nausea or vomiting.  Patient does have a history of diabetes  I have personally reviewed this patient's medical records.  He has history of type 2 diabetes, hyperlipidemia, and obesity.  HPI     Past Medical History:  Diagnosis Date   Hyperlipidemia 2017   Microalbuminuria 02/10/2017   Morbid obesity with body mass index (BMI) of 50.0 to 59.9 in adult [facility]) 2016   Type 2 diabetes mellitus with hyperglycemia, without long-term current use of insulin ([place]) 05/21/2007    Patient Active Problem List   Diagnosis Date Noted   Microalbuminuria 02/10/2017   Elevated liver enzymes 07/25/2016   Hyperlipidemia 07/25/2016   Morbid obesity with body mass index (BMI) of 50.0 to 59.9 in adult [facility]) 05/20/2014   Morbid obesity ([place]) 01/22/2013   Type 2 diabetes mellitus with hyperglycemia, without long-term current use of insulin ([place]) 05/21/2007    Past Surgical History:  Procedure Laterality Date   CESAREAN SECTION     CESAREAN SECTION     CESAREAN SECTION     TUBAL LIGATION  06/28/2007     OB History     Gravida  4   Para  3   Term  3   Preterm  0   AB  1   Living  0       SAB  1   IAB  0   Ectopic  0   Multiple  0   Live Births              Family History  Problem Relation Age of Onset   Diabetes Mother    Cancer Mother 67       Uterine   HIV/AIDS Brother    Asthma Son     Social History   Tobacco Use   Smoking status: Never   Smokeless tobacco: Never  Vaping Use   Vaping Use: Never used  Substance Use Topics   Alcohol use: No    Alcohol/week: 0.0 standard drinks   Drug use: No    Home Medications Prior to Admission medications   Medication Sig Start Date End Date Taking? Authorizing Provider  atorvastatin (LIPITOR) 20 MG tablet TAKE 1 TABLET (20 MG TOTAL) BY MOUTH DAILY. 09/14/20 09/14/21  Kerin Perna, NP  Blood Glucose Monitoring Suppl [facility] PRESTO) w/Device KIT Check sugars twice daily before meals 06/12/16   Mack Hook, MD  Dulaglutide 0.75 MG/0.5ML SOPN INJECT 0.75 MG INTO THE SKIN ONCE A WEEK. 06/13/20 06/13/21  Kerin Perna, NP  glucose blood (AGAMATRIX PRESTO TEST) test strip Check sugars twice daily before meals 06/12/16   Mack Hook, MD  ibuprofen (ADVIL) 800 MG tablet Take 1 tablet (800 mg  total) by mouth every 8 (eight) hours as needed. 10/23/20   Kerin Perna, NP  lisinopril (ZESTRIL) 5 MG tablet TAKE 1 TABLET (5 MG TOTAL) BY MOUTH DAILY. 06/13/20 06/13/21  Kerin Perna, NP  metFORMIN (GLUCOPHAGE) 1000 MG tablet TAKE 1 TABLET BY MOUTH TWICE DAILY WITH A MEAL. 06/13/20 06/13/21  Kerin Perna, NP  Omega-3 Fatty Acids (FISH OIL) 1200 MG CAPS Take by mouth daily.    [provider]    Allergies    Patient has no known allergies.  Review of Systems   Review of Systems  Constitutional: Negative.   HENT: Negative.    Respiratory: Negative.    Cardiovascular: Negative.   Gastrointestinal: Negative.   Genitourinary: Negative.   Musculoskeletal: Negative.   Skin:  Positive for rash.  Neurological: Negative.    Physical Exam Updated Vital Signs BP 125/74 (BP  Location: Left Arm)   Pulse 70   Temp 98.4 F (36.9 C) (Oral)   Resp 16   SpO2 97%   Physical Exam Vitals and nursing note reviewed.  HENT:     Head: Normocephalic and atraumatic.     Mouth/Throat:     Mouth: Mucous membranes are moist.     Pharynx: No oropharyngeal exudate or posterior oropharyngeal erythema.  Eyes:     General:        Right eye: No discharge.        Left eye: No discharge.     Conjunctiva/sclera: Conjunctivae normal.  Cardiovascular:     Rate and Rhythm: Normal rate and regular rhythm.     Pulses: Normal pulses.     Heart sounds: Normal heart sounds. No murmur heard. Pulmonary:     Effort: Pulmonary effort is normal. No respiratory distress.     Breath sounds: Normal breath sounds. No wheezing or rales.  Abdominal:     General: Bowel sounds are normal. There is no distension.     Palpations: Abdomen is soft.     Tenderness: There is no abdominal tenderness.  Musculoskeletal:        General: No deformity.     Cervical back: Neck supple.     Right lower leg: No edema.     Left lower leg: No edema.  Skin:    General: Skin is warm and dry.     Capillary Refill: Capillary refill takes less than 2 seconds.     Findings: Rash present. Rash is macular.       Neurological:     General: No focal deficit present.     Mental Status: She is alert and oriented to person, place, and time. Mental status is at baseline.  Psychiatric:        Mood and Affect: Mood normal.    ED Results / Procedures / Treatments   Labs (all labs ordered are listed, but only abnormal results are displayed) Labs Reviewed - No data to display  EKG None  Radiology No results found.  Procedures Procedures   Medications Ordered in ED Medications  diphenhydrAMINE (BENADRYL) capsule 50 mg (50 mg Oral Given 10/30/20 2132)    ED Course  I have reviewed the triage vital signs and the nursing notes.  Pertinent labs & imaging results that were available during my care of the  patient were reviewed by me and considered in my medical decision making (see chart for details).    MDM Rules/Calculators/A&P  45 year old female presents with itchy rash to the right face and right shoulder after working in her garden.  Differential diagnosis includes but is not limited to atopic dermatitis, contact dermatitis, poison oak, poison ivy, poison sumac, or other allergic reaction.  Vitals are normal on intake.  Cardiopulmonary exam is normal, abdominal exam is benign.  Oral pharyngeal exam not reveal any oropharyngeal edema, no sublingual or submental edema or tenderness to palpation.  No lingual edema or uvular deviation, no tracheal deviation or anterior neck crepitus or swelling.  There is erythematous macular rash over the right face, right neck, and right shoulder, with exacerbated pruritus with physical touch.  There are no vesicles or papules, no breaks in the skin or sloughing of the skin.  Based on physical exam concern for contact dermatitis, likely secondary to poison ivy, will administer dose of Benadryl in the emergency department.  As patient has history of diabetes, will not discharge with course of steroid, however will recommend oatmeal baths, wet compresses that are cool, ice packs, and calamine lotion.  Additionally patient has been instructed to proceed with Benadryl daily for the next few days, followed by Zyrtec.  Recommend close follow-up with her primary care doctor.  No further work-up warranted in the ED at this time given reassuring physical exam and vital signs.  No concern for anaphylaxis given reassuring oropharyngeal and pulmonary exam.  Rexanna voiced understanding of her medical evaluation and treatment plan.  Each of her questions answered to her expressed satisfaction.  Return precautions were given.  Patient is well-appearing, stable, and appropriate for discharge at this time.  This chart was dictated using voice recognition  software, Dragon. Despite the best efforts of this provider to proofread and correct errors, errors may still occur which can change documentation meaning.   Final Clinical Impression(s) / ED Diagnoses Final diagnoses:  Rash  Allergic contact dermatitis, unspecified trigger    Rx / DC Orders ED Discharge Orders     None        Aura Dials 10/30/20 2219    Arnaldo Natal, MD 10/30/20 2229

## 2020-11-08 ENCOUNTER — Other Ambulatory Visit: Payer: Self-pay

## 2020-11-08 MED FILL — Lisinopril Tab 5 MG: ORAL | 30 days supply | Qty: 30 | Fill #1 | Status: AC

## 2020-11-08 MED FILL — Dulaglutide Soln Auto-injector 0.75 MG/0.5ML: SUBCUTANEOUS | 28 days supply | Qty: 2 | Fill #1 | Status: AC

## 2020-12-11 ENCOUNTER — Other Ambulatory Visit: Payer: Self-pay

## 2020-12-11 ENCOUNTER — Encounter (INDEPENDENT_AMBULATORY_CARE_PROVIDER_SITE_OTHER): Payer: Self-pay | Admitting: Primary Care

## 2020-12-11 ENCOUNTER — Ambulatory Visit (INDEPENDENT_AMBULATORY_CARE_PROVIDER_SITE_OTHER): Payer: Self-pay | Admitting: Primary Care

## 2020-12-11 VITALS — BP 98/52 | HR 69 | Temp 97.5°F | Ht 59.0 in | Wt 279.6 lb

## 2020-12-11 DIAGNOSIS — E119 Type 2 diabetes mellitus without complications: Secondary | ICD-10-CM

## 2020-12-11 DIAGNOSIS — E782 Mixed hyperlipidemia: Secondary | ICD-10-CM

## 2020-12-11 DIAGNOSIS — Z1211 Encounter for screening for malignant neoplasm of colon: Secondary | ICD-10-CM

## 2020-12-11 DIAGNOSIS — Z760 Encounter for issue of repeat prescription: Secondary | ICD-10-CM

## 2020-12-11 DIAGNOSIS — I10 Essential (primary) hypertension: Secondary | ICD-10-CM

## 2020-12-11 DIAGNOSIS — E1165 Type 2 diabetes mellitus with hyperglycemia: Secondary | ICD-10-CM

## 2020-12-11 LAB — POCT GLYCOSYLATED HEMOGLOBIN (HGB A1C): Hemoglobin A1C: 6.3 % — AB (ref 4.0–5.6)

## 2020-12-11 MED ORDER — DULAGLUTIDE 0.75 MG/0.5ML ~~LOC~~ SOAJ
0.7500 mg | SUBCUTANEOUS | 11 refills | Status: DC
  Filled 2020-12-11 – 2020-12-22 (×2): qty 2, 28d supply, fill #0
  Filled 2021-03-02: qty 2, 28d supply, fill #1
  Filled 2021-06-06: qty 2, 28d supply, fill #0

## 2020-12-11 MED ORDER — LISINOPRIL 5 MG PO TABS
ORAL_TABLET | Freq: Every day | ORAL | 3 refills | Status: DC
  Filled 2020-12-11 – 2021-06-06 (×3): qty 30, 30d supply, fill #0

## 2020-12-11 MED ORDER — ATORVASTATIN CALCIUM 20 MG PO TABS
ORAL_TABLET | Freq: Every day | ORAL | 1 refills | Status: DC
  Filled 2020-12-11 – 2021-06-06 (×6): qty 30, 30d supply, fill #0

## 2020-12-11 MED ORDER — METFORMIN HCL 1000 MG PO TABS
ORAL_TABLET | Freq: Two times a day (BID) | ORAL | 1 refills | Status: DC
  Filled 2020-12-11 – 2021-06-06 (×3): qty 60, 30d supply, fill #0

## 2020-12-11 NOTE — Patient Instructions (Signed)
Recuento de caloras para bajar de peso Calorie Counting for Weight Loss Las caloras son unidades de energa. El cuerpo necesita una cierta cantidad de caloras de los alimentos para que lo ayuden a funcionar durante todo el da. Cuando se comen o beben ms caloras de las que el cuerpo necesita, este acumula las caloras adicionales mayormente como grasa. Cuando se comen o beben menos caloras de las que el cuerpo necesita, este quema grasa para obtener laenerga que necesita. El recuento de caloras es el registro de la cantidad de caloras que se comen y beben cada da. El recuento de caloras puede ser de ayuda si necesita perder peso. Si come menos caloras de las que el cuerpo necesita, debera bajar depeso. Pregntele al mdico cul es un peso sano para usted. Para que el recuento de caloras funcione, usted tendr que ingerir la cantidad de caloras adecuadas cada da, para bajar una cantidad de peso saludable por semana. Un nutricionista puede ayudar a determinar la cantidad de caloras que usted necesita por da y sugerirle formas de alcanzar su objetivo calrico. Una cantidad de peso saludable para bajar cada semana suele ser entre 1 y 2 libras (0.5 a 0.9 kg). Esto habitualmente significa que su ingesta diaria de caloras se debera reducir en unas 500 a 750 caloras. Ingerir de 1200 a 1500 caloras por da puede ayudar a la mayora de las mujeres a bajar de peso. Ingerir de 1500 a 1800 caloras por da puede ayudar a la mayora de los hombres a bajar de peso. Qu debo saber acerca del recuento de caloras? Trabaje con el mdico o el nutricionista para determinar cuntas caloras debe recibir cada da. A fin de alcanzar su objetivo diario de caloras, tendr que: Averiguar cuntas caloras hay en cada alimento que le gustara comer. Intente hacerlo antes de comer. Decidir la cantidad que puede comer del alimento. Llevar un registro de los alimentos. Para esto, anote lo que comi y cuntas  caloras tena. Para perder peso con xito, es importante equilibrar el recuento de calorascon un estilo de vida saludable que incluya actividad fsica de forma regular. Dnde encuentro informacin sobre las caloras?  Es posible encontrar la cantidad de caloras que contiene un alimento en la etiqueta de informacin nutricional. Si un alimento no tiene una etiqueta de informacin nutricional, intente buscar las caloras en Internet o pida ayudaal nutricionista. Recuerde que las caloras se calculan por porcin. Si opta por comer ms de una porcin de un alimento, tendr que multiplicar las caloras de una porcin por la cantidad de porciones que planea comer. Por ejemplo, la etiqueta de un envase de pan puede decir que el tamao de una porcin es 1 rodaja, y que una porcin tiene 90 caloras. Si come 1 rodaja, habr comido 90 caloras. Si come2 rodajas, habr comido 180 caloras. Cmo llevo un registro de comidas? Despus de cada vez que coma, anote lo siguiente en el registro de alimentos lo antes posible: Lo que comi. Asegrese de incluir los aderezos, las salsas y otros extras en los alimentos. La cantidad que comi. Esto se puede medir en tazas, onzas o cantidad de alimentos. Cuntas caloras haba en cada alimento y en cada bebida. La cantidad total de caloras en la comida que tom. Tenga a mano el registro de alimentos, por ejemplo, en un anotador de bolsillo o utilice una aplicacin o sitio web en el telfono mvil. Algunos programas calcularn las caloras por usted y le mostrarn la cantidad de caloras que lequedan para llegar   al objetivo diario. Cules son algunos consejos para controlar las porciones? Sepa cuntas caloras hay en una porcin. Esto lo ayudar a saber cuntas porciones de un alimento determinado puede comer. Use una taza medidora para medir los tamaos de las porciones. Tambin puede intentar pesar las porciones en una balanza de cocina. Con el tiempo, podr hacer un  clculo estimativo de los tamaos de las porciones de algunos alimentos. Dedique tiempo a poner porciones de diferentes alimentos en sus platos, tazones y tazas predilectos, a fin de saber cmo se ve una porcin. Intente no comer directamente de un envase de alimentos, por ejemplo, de una bolsa o una caja. Comer directamente del envase dificulta ver cunto est comiendo y puede conducir a comer en exceso. Ponga la cantidad que le gustara comer en una taza o un plato, a fin de asegurarse de que est comiendo la porcin correcta. Use platos, vasos y tazones ms pequeos para medir porciones ms pequeas y evitar no comer en exceso. Intente no realizar varias tareas al mismo tiempo. Por ejemplo, evite mirar televisin o usar la computadora mientras come. Si es la hora de comer, sintese a la mesa y disfrute de la comida. Esto lo ayudar a reconocer cundo est satisfecho. Tambin le permitir estar ms consciente de qu come y cunto come. Consejos para seguir este plan Al leer las etiquetas de los alimentos Controle el recuento de caloras en comparacin con el tamao de la porcin. El tamao de la porcin puede ser ms pequeo de lo que suele comer. Verifique la fuente de las caloras. Intente elegir alimentos ricos en protenas, fibras y vitaminas, y bajos en grasas saturadas, grasas trans y sodio. Al ir de compras Lea las etiquetas nutricionales cuando compre. Esto lo ayudar a tomar decisiones saludables sobre qu alimentos comprar. Preste atencin a las etiquetas nutricionales de alimentos bajos en grasas o sin grasas. Estos alimentos a veces tienen la misma cantidad de caloras o ms caloras que las versiones ricas en grasas. Con frecuencia, tambin tienen agregados de azcar, almidn o sal, para darles el sabor que fue eliminado con las grasas. Haga una lista de compras con los alimentos que tienen un menor contenido de caloras y resptela. Al cocinar Intente cocinar sus alimentos preferidos de  una manera ms saludable. Por ejemplo, pruebe hornear en vez de frer. Utilice productos lcteos descremados. Planificacin de las comidas Utilice ms frutas y verduras. La mitad de su plato debe ser de frutas y verduras. Incluya protenas magras, como pollo, pavo y pescado. Estilo de vida Cada semana, trate de hacer una de las siguientes cosas: 150 minutos de ejercicio moderado, como caminar. 75 minutos de ejercicio enrgico, como correr. Informacin general Sepa cuntas caloras tienen los alimentos que come con ms frecuencia. Esto le ayudar a contar las caloras ms rpidamente. Encuentre un mtodo para controlar las caloras que funcione para usted. Sea creativo. Pruebe aplicaciones o programas distintos, si llevar un registro de las caloras no funciona para usted. Qu alimentos debo consumir?  Consuma alimentos nutritivos. Es mejor comer un alimento nutritivo, de alto contenido calrico, como un aguacate, que uno con pocos nutrientes, como una bolsa de patatas fritas. Use sus caloras en alimentos y bebidas que lo sacien y no lo dejen con apetito apenas termina de comer. Ejemplos de alimentos que lo sacian son los frutos secos y mantequillas de frutos secos, verduras, protenas magras y alimentos con alto contenido de fibra como los cereales integrales. Los alimentos con alto contenido de fibra son aquellos que   tienen ms de 5 g de fibra por porcin. Preste atencin a las caloras en las bebidas. Las bebidas de bajas caloras incluyen agua y refrescos sin azcar. Es posible que los productos que se enumeran ms arriba no constituyan una lista completa de los alimentos y las bebidas que puede tomar. Consulte a un nutricionista para obtener ms informacin. Qu alimentos debo limitar? Limite el consumo de alimentos o bebidas que no sean buenas fuentes de vitaminas, minerales o protenas, o que tengan alto contenido de grasas no saludables. Estos incluyen: Caramelos. Otros  dulces. Refrescos, bebidas con caf especiales, alcohol y jugo. Es posible que los productos que se enumeran ms arriba no constituyan una lista completa de los alimentos y las bebidas que debe evitar. Consulte a un nutricionista para obtener ms informacin. Cmo puedo hacer el recuento de caloras cuando como afuera? Preste atencin a las porciones. A menudo, las porciones son mucho ms grandes al comer afuera. Pruebe con estos consejos para mantener las porciones ms pequeas: Considere la posibilidad de compartir una comida en lugar de tomarla toda usted solo. Si pide su propia comida, coma solo la mitad. Antes de empezar a comer, pida un recipiente y ponga la mitad de la comida en l. Cuando sea posible, considere la posibilidad de pedir porciones ms pequeas del men en lugar de porciones completas. Preste atencin a la eleccin de alimentos y bebidas. Saber la forma en que se cocinan los alimentos y lo que incluye la comida puede ayudarlo a ingerir menos caloras. Si se detallan las caloras en el men, elija las opciones que contengan la menor cantidad. Elija platos que incluyan verduras, frutas, cereales integrales, productos lcteos con bajo contenido de grasa y protenas magras. Opte por los alimentos hervidos, asados, cocidos a la parrilla o al vapor. Evite los alimentos a los que se les ponga mantequilla, que estn empanados o fritos, o que se sirvan con salsa a base de crema. Generalmente, los alimentos que se etiquetan como "crujientes" estn fritos, a menos que se indique lo contrario. Elija el agua, la leche descremada, el t helado sin azcar u otras bebidas que no contengan azcares agregados. Si desea una bebida alcohlica, escoja una opcin con menos caloras, como una copa de vino o una cerveza ligera. Ordene los aderezos, las salsas y los jarabes aparte. Estos son, con frecuencia, de alto contenido en caloras, por lo que debe limitar la cantidad que ingiere. Si desea una  ensalada, elija una de hortalizas y pida carnes a la parrilla. Evite las guarniciones adicionales como el tocino, el queso o los alimentos fritos. Ordene el aderezo aparte o pida aceite de oliva y vinagre o limn para aderezar. Haga un clculo estimativo de la cantidad de porciones que le sirven. Conocer el tamao de las porciones lo ayudar a estar atento a la cantidad de comida que come en los restaurantes. Dnde buscar ms informacin Centers for Disease Control and Prevention (Centros para el Control y la Prevencin de Enfermedades): www.cdc.gov U.[place]. Department of Agriculture (Departamento de Agricultura de los [place]): myplate.gov Resumen El recuento de caloras es el registro de la cantidad de caloras que se comen y beben cada da. Si come menos caloras de las que el cuerpo necesita, debera bajar de peso. Una cantidad de peso saludable para bajar por semana suele ser entre 1 y 2 libras (0.5 a 0.9 kg). Esto significa, con frecuencia, reducir su ingesta diaria de caloras unas 500 a 750 caloras. Es posible encontrar la cantidad de caloras que   contiene un alimento en la etiqueta de informacin nutricional. Si un alimento no tiene una etiqueta de informacin nutricional, intente buscar las caloras en Internet o pida ayuda al nutricionista. Use platos, vasos y tazones ms pequeos para medir porciones ms pequeas y evitar no comer en exceso. Use sus caloras en alimentos y bebidas que lo sacien y no lo dejen con apetito poco tiempo despus de haber comido. Esta informacin no tiene como fin reemplazar el consejo del mdico. Asegresede hacerle al mdico cualquier pregunta que tenga. Document Revised: 08/30/2019 Document Reviewed: 08/30/2019 Elsevier Patient Education  2022 [employer]  

## 2020-12-11 NOTE — Progress Notes (Signed)
Subjective:  Patient ID: Mackenzie Ford, female    DOB: 1976/04/11  Age: 45 y.o. MRN: [id number]  CC: Diabetes   HPI Mackenzie Ford is a morbid obese Hispanic female Mackenzie Ford [id number]) presents for Follow-up of diabetes. Patient does not check blood sugar at home  Compliant with meds - Yes Checking CBGs? No  Fasting avg -   Postprandial average -  Exercising regularly? - Yes Watching carbohydrate intake? - Yes Neuropathy ? - No Hypoglycemic events - No  - Recovers with :   Pertinent ROS:  Polyuria - No Polydipsia - No Vision problems - No  Medications as noted below. Taking them regularly without complication/adverse reaction being reported today.   History Mackenzie Ford has a past medical history of Hyperlipidemia (2017), Microalbuminuria (02/10/2017), Morbid obesity with body mass index (BMI) of 50.0 to 59.9 in adult [facility]) (2016), and Type 2 diabetes mellitus with hyperglycemia, without long-term current use of insulin ([place]) (05/21/2007).   She has a past surgical history that includes Cesarean section; Cesarean section; Cesarean section; and Tubal ligation (06/28/2007).   Her family history includes Asthma in her son; Cancer (age of onset: 4) in her mother; Diabetes in her mother; HIV/AIDS in her brother.She reports that she has never smoked. She has never used smokeless tobacco. She reports that she does not drink alcohol and does not use drugs.  Current Outpatient Medications on File Prior to Visit  Medication Sig Dispense Refill   atorvastatin (LIPITOR) 20 MG tablet TAKE 1 TABLET (20 MG TOTAL) BY MOUTH DAILY. 90 tablet 1   Blood Glucose Monitoring Suppl (AGAMATRIX PRESTO) w/Device KIT Check sugars twice daily before meals 1 kit 0   Dulaglutide 0.75 MG/0.5ML SOPN INJECT 0.75 MG INTO THE SKIN ONCE A WEEK. 2 mL 11   glucose blood (AGAMATRIX PRESTO TEST) test strip Check sugars twice daily before meals 100 each 12   ibuprofen (ADVIL) 800 MG tablet Take 1 tablet (800 mg  total) by mouth every 8 (eight) hours as needed. 90 tablet 1   lisinopril (ZESTRIL) 5 MG tablet TAKE 1 TABLET (5 MG TOTAL) BY MOUTH DAILY. 90 tablet 3   metFORMIN (GLUCOPHAGE) 1000 MG tablet TAKE 1 TABLET BY MOUTH TWICE DAILY WITH A MEAL. 180 tablet 1   Omega-3 Fatty Acids (FISH OIL) 1200 MG CAPS Take by mouth daily.     No current facility-administered medications on file prior to visit.    ROS Review of Systems  All other systems reviewed and are negative.  Objective:  BP (!) 98/52 (BP Location: Right Arm, Patient Position: Sitting, Cuff Size: Large)   Pulse 69   Temp (!) 97.5 F (36.4 C) (Temporal)   Ht _0  (1.499 m)   Wt 279 lb 9.6 oz (126.8 kg)   SpO2 97%   BMI 56.47 kg/m   BP Readings from Last 3 Encounters:  12/11/20 (!) 98/52  10/30/20 125/74  10/23/20 134/82    Wt Readings from Last 3 Encounters:  12/11/20 279 lb 9.6 oz (126.8 kg)  10/23/20 275 lb 6.4 oz (124.9 kg)  09/11/20 277 lb 3.2 oz (125.7 kg)    Physical Exam Vitals reviewed.  Constitutional:      Appearance: She is obese.     Comments: morbid  HENT:     Right Ear: Tympanic membrane and external ear normal.     Left Ear: Tympanic membrane and external ear normal.     Nose: Nose normal.  Eyes:     Extraocular Movements:  Extraocular movements intact.     Pupils: Pupils are equal, round, and reactive to light.  Cardiovascular:     Rate and Rhythm: Normal rate and regular rhythm.  Pulmonary:     Effort: Pulmonary effort is normal.     Breath sounds: Normal breath sounds.  Abdominal:     General: Bowel sounds are normal. There is distension.     Palpations: Abdomen is soft.  Musculoskeletal:        General: Normal range of motion.     Cervical back: Normal range of motion and neck supple.  Skin:    General: Skin is warm and dry.  Neurological:     Mental Status: She is oriented to person, place, and time.  Psychiatric:        Mood and Affect: Mood normal.        Behavior: Behavior normal.    Lab Results  Component Value Date   HGBA1C 6.3 (A) 12/11/2020   HGBA1C 6.3 (A) 09/11/2020   HGBA1C 8.9 (A) 06/13/2020    Lab Results  Component Value Date   WBC 10.0 09/11/2020   HGB 12.5 09/11/2020   HCT 39.6 09/11/2020   PLT 359 09/11/2020   GLUCOSE 111 (H) 09/11/2020   CHOL 177 09/11/2020   TRIG 163 (H) 09/11/2020   HDL 45 09/11/2020   LDLCALC 103 (H) 09/11/2020   ALT 36 (H) 09/11/2020   AST 32 09/11/2020   NA 142 09/11/2020   K 4.2 09/11/2020   CL 105 09/11/2020   CREATININE 0.60 09/11/2020   BUN 9 09/11/2020   CO2 21 09/11/2020   TSH 2.650 09/03/2019   HGBA1C 6.3 (A) 12/11/2020     Assessment & Plan:   Mackenzie Ford was seen today for diabetes.  Diagnoses and all orders for this visit:  Type 2 diabetes mellitus without complication, without long-term current use of insulin (HCC) -     HgB A1c -     Comprehensive metabolic panel -     CBC with Differential  Colon cancer screening -     Fecal occult blood, imunochemical; Future  Mixed hyperlipidemia  Healthy lifestyle diet of fruits vegetables fish nuts whole grains and low saturated fat . Foods high in cholesterol or liver, fatty meats,cheese, butter avocados, nuts and seeds, chocolate and fried foods.   -     Lipid panel Medication refill -     atorvastatin (LIPITOR) 20 MG tablet; TAKE 1 TABLET (20 MG TOTAL) BY MOUTH DAILY. -     Dulaglutide 0.75 MG/0.5ML SOPN; INJECT 0.75 MG INTO THE SKIN ONCE A WEEK. -     lisinopril (ZESTRIL) 5 MG tablet; TAKE 1 TABLET (5 MG TOTAL) BY MOUTH DAILY. -     metFORMIN (GLUCOPHAGE) 1000 MG tablet; TAKE 1 TABLET BY MOUTH TWICE DAILY WITH A MEAL.    I am having Mackenzie Ford maintain her AgaMatrix Presto, glucose blood, Fish Oil, Dulaglutide, lisinopril, metFORMIN, atorvastatin, and ibuprofen.  No orders of the defined types were placed in this encounter.    Follow-up:   Return in about 6 months (around 06/13/2021) for Pap/DM/fasting labs.  The above assessment  and management plan was discussed with the patient. The patient verbalized understanding of and has agreed to the management plan. Patient is aware to call the clinic if symptoms fail to improve or worsen. Patient is aware when to return to the clinic for a follow-up visit. Patient educated on when it is appropriate to go to the emergency department.  [name], NP-C

## 2020-12-12 LAB — CBC WITH DIFFERENTIAL/PLATELET
Basophils Absolute: 0.1 10*3/uL (ref 0.0–0.2)
Basos: 1 %
EOS (ABSOLUTE): 0.2 10*3/uL (ref 0.0–0.4)
Eos: 3 %
Hematocrit: 41 % (ref 34.0–46.6)
Hemoglobin: 12.4 g/dL (ref 11.1–15.9)
Immature Grans (Abs): 0 10*3/uL (ref 0.0–0.1)
Immature Granulocytes: 0 %
Lymphocytes Absolute: 3.7 10*3/uL — ABNORMAL HIGH (ref 0.7–3.1)
Lymphs: 39 %
MCH: 26.8 pg (ref 26.6–33.0)
MCHC: 30.2 g/dL — ABNORMAL LOW (ref 31.5–35.7)
MCV: 89 fL (ref 79–97)
Monocytes Absolute: 0.6 10*3/uL (ref 0.1–0.9)
Monocytes: 6 %
Neutrophils Absolute: 4.8 10*3/uL (ref 1.4–7.0)
Neutrophils: 51 %
Platelets: 350 10*3/uL (ref 150–450)
RBC: 4.62 x10E6/uL (ref 3.77–5.28)
RDW: 12.7 % (ref 11.7–15.4)
WBC: 9.5 10*3/uL (ref 3.4–10.8)

## 2020-12-12 LAB — COMPREHENSIVE METABOLIC PANEL
ALT: 29 IU/L (ref 0–32)
AST: 22 IU/L (ref 0–40)
Albumin/Globulin Ratio: 1.1 — ABNORMAL LOW (ref 1.2–2.2)
Albumin: 3.6 g/dL — ABNORMAL LOW (ref 3.8–4.8)
Alkaline Phosphatase: 107 IU/L (ref 44–121)
BUN/Creatinine Ratio: 17 (ref 9–23)
BUN: 8 mg/dL (ref 6–24)
Bilirubin Total: <0.2 mg/dL (ref 0.0–1.2)
CO2: 21 mmol/L (ref 20–29)
Calcium: 8.5 mg/dL — ABNORMAL LOW (ref 8.7–10.2)
Chloride: 109 mmol/L — ABNORMAL HIGH (ref 96–106)
Creatinine, Ser: 0.46 mg/dL — ABNORMAL LOW (ref 0.57–1.00)
Globulin, Total: 3.2 g/dL (ref 1.5–4.5)
Glucose: 111 mg/dL — ABNORMAL HIGH (ref 65–99)
Potassium: 4.7 mmol/L (ref 3.5–5.2)
Sodium: 142 mmol/L (ref 134–144)
Total Protein: 6.8 g/dL (ref 6.0–8.5)
eGFR: 119 mL/min/{1.73_m2} (ref >59)

## 2020-12-12 LAB — LIPID PANEL
Chol/HDL Ratio: 4 ratio (ref 0.0–4.4)
Cholesterol, Total: 184 mg/dL (ref 100–199)
HDL: 46 mg/dL (ref >39)
LDL Chol Calc (NIH): 102 mg/dL — ABNORMAL HIGH (ref 0–99)
Triglycerides: 208 mg/dL — ABNORMAL HIGH (ref 0–149)
VLDL Cholesterol Cal: 36 mg/dL (ref 5–40)

## 2020-12-18 ENCOUNTER — Other Ambulatory Visit: Payer: Self-pay

## 2020-12-22 ENCOUNTER — Telehealth (INDEPENDENT_AMBULATORY_CARE_PROVIDER_SITE_OTHER): Payer: Self-pay

## 2020-12-22 ENCOUNTER — Other Ambulatory Visit: Payer: Self-pay

## 2020-12-22 NOTE — Telephone Encounter (Signed)
Call placed to patient using pacific interpreter [phone]) per DPR left voicemail with results and medication. Asked to return call to [facility] at [phone]. Maryjean Morn, CMA

## 2020-12-22 NOTE — Telephone Encounter (Signed)
-----   Message from Grayce Sessions, NP sent at 12/20/2020  1:44 PM EDT ----- Your cholesterol is still high, but continued recommendations to make lifestyle changes. Your LDL is above normal. The LDL is the bad cholesterol. Over time and in combination with inflammation and other factors, this contributes to plaque which in turn may lead to stroke and/or heart attack down the road. Sometimes high LDL is primarily genetic, and people might be eating all the right foods but still have high numbers. Other times, there is room for improvement in one's diet and eating healthier can bring this number down and potentially reduce one's risk of heart attack and/or stroke.   To reduce your LDL, Remember - more fruits and vegetables, more fish, and limit red meat and dairy products. More soy, nuts, beans, barley, lentils, oats and plant sterol ester enriched margarine instead of butter. I also encourage eliminating sugar and processed food. Remember, shop on the outside of the grocery store and visit your [employer]. If you would like to talk with me about dietary changes plus or minus medications for your cholesterol, please let me know. We should recheck your cholesterol in 3-6 months. Continue taking your atorvastatin 20mg  nightly refills are available

## 2021-01-04 ENCOUNTER — Ambulatory Visit: Payer: Self-pay | Admitting: Physician Assistant

## 2021-01-04 ENCOUNTER — Other Ambulatory Visit: Payer: Self-pay

## 2021-01-04 VITALS — BP 134/55 | HR 80 | Temp 98.2°F | Resp 18 | Ht 60.0 in | Wt 280.0 lb

## 2021-01-04 DIAGNOSIS — R0789 Other chest pain: Secondary | ICD-10-CM

## 2021-01-04 DIAGNOSIS — F439 Reaction to severe stress, unspecified: Secondary | ICD-10-CM

## 2021-01-04 DIAGNOSIS — F5104 Psychophysiologic insomnia: Secondary | ICD-10-CM

## 2021-01-04 DIAGNOSIS — Z6841 Body Mass Index (BMI) 40.0 and over, adult: Secondary | ICD-10-CM

## 2021-01-04 MED ORDER — OMEPRAZOLE 20 MG PO CPDR
20.0000 mg | DELAYED_RELEASE_CAPSULE | Freq: Every day | ORAL | 3 refills | Status: DC
  Filled 2021-01-04 – 2021-03-28 (×2): qty 30, 30d supply, fill #0

## 2021-01-04 MED ORDER — HYDROXYZINE HCL 10 MG PO TABS
10.0000 mg | ORAL_TABLET | Freq: Three times a day (TID) | ORAL | 0 refills | Status: DC | PRN
  Filled 2021-01-04: qty 60, 20d supply, fill #0

## 2021-01-04 NOTE — Progress Notes (Signed)
Established Patient Office Visit  Subjective:  Patient ID: Mackenzie Ford, female    DOB: 1975/05/29  Age: 45 y.o. MRN: [id number]  CC:  Chief Complaint  Patient presents with   Headache    HPI [name] reports that she has been having headaches and chest pain for the last 3 weeks.  Describes the headaches as "all over", states that she will have some nausea when she has the headaches.  Denies vomiting, photophobia.  Reports that she will use ibuprofen without relief.  Reports that she is drinking approximately 4 bottles of water.  No recent eye exam.  Reports she has been unable to sleep very well for the last 3 weeks, states that she tosses and turns, racing thoughts.  Reports that she has been napping during the day, has not tried anything for relief.  Reports that she has been having intermittent chest pains, states they are midsternal, burning, does endorse acid reflux.  Has not tried anything for relief.  Reports stressor 3 weeks ago, states her son is in the hospital in [place], and she not been able to see him.  Reports prior to learning this news she was not having difficulty with sleep nor having headaches.  Due to language barrier, an interpreter was present during the history-taking and subsequent discussion (and for part of the physical exam) with this patient.   Past Medical History:  Diagnosis Date   Hyperlipidemia 2017   Microalbuminuria 02/10/2017   Morbid obesity with body mass index (BMI) of 50.0 to 59.9 in adult [facility]) 2016   Type 2 diabetes mellitus with hyperglycemia, without long-term current use of insulin ([place]) 05/21/2007    Past Surgical History:  Procedure Laterality Date   CESAREAN SECTION     CESAREAN SECTION     CESAREAN SECTION     TUBAL LIGATION  06/28/2007    Family History  Problem Relation Age of Onset   Diabetes Mother    Cancer Mother 24       Uterine   HIV/AIDS Brother    Asthma Son     Social History    Socioeconomic History   Marital status: Married    Spouse name: [name]   Number of children: 3   Years of education: 9   Highest education level: Not on file  Occupational History   Occupation: Housewife  Tobacco Use   Smoking status: Never   Smokeless tobacco: Never  Vaping Use   Vaping Use: Never used  Substance and Sexual Activity   Alcohol use: No    Alcohol/week: 0.0 standard drinks   Drug use: No   Sexual activity: Yes    Partners: Male    Birth control/protection: Surgical  Other Topics Concern   Not on file  Social History Narrative   Came to [place]   Lives at home with husband who is patient here, Occupational hygienist, and their 3 children.   Social Determinants of Health   Financial Resource Strain: Not on file  Food Insecurity: Not on file  Transportation Needs: Not on file  Physical Activity: Not on file  Stress: Not on file  Social Connections: Not on file  Intimate Partner Violence: Not on file    Outpatient Medications Prior to Visit  Medication Sig Dispense Refill   atorvastatin (LIPITOR) 20 MG tablet TAKE 1 TABLET (20 MG TOTAL) BY MOUTH DAILY. 90 tablet 1   Blood Glucose Monitoring Suppl (AGAMATRIX PRESTO) w/Device KIT Check sugars twice daily before meals  1 kit 0   Dulaglutide 0.75 MG/0.5ML SOPN INJECT 0.75 MG INTO THE SKIN ONCE A WEEK. 2 mL 11   glucose blood (AGAMATRIX PRESTO TEST) test strip Check sugars twice daily before meals 100 each 12   ibuprofen (ADVIL) 800 MG tablet Take 1 tablet (800 mg total) by mouth every 8 (eight) hours as needed. 90 tablet 1   lisinopril (ZESTRIL) 5 MG tablet TAKE 1 TABLET (5 MG TOTAL) BY MOUTH DAILY. 90 tablet 3   metFORMIN (GLUCOPHAGE) 1000 MG tablet TAKE 1 TABLET BY MOUTH TWICE DAILY WITH A MEAL. 180 tablet 1   No facility-administered medications prior to visit.    No Known Allergies  ROS Review of Systems  Constitutional: Negative.   HENT: Negative.    Eyes: Negative.   Respiratory:  Negative for shortness of  breath.   Cardiovascular:  Positive for chest pain. Negative for palpitations and leg swelling.  Gastrointestinal:  Positive for nausea. Negative for vomiting.  Endocrine: Negative.   Genitourinary: Negative.   Musculoskeletal: Negative.   Skin: Negative.   Allergic/Immunologic: Negative.   Neurological:  Positive for headaches. Negative for dizziness, syncope and weakness.  Hematological: Negative.   Psychiatric/Behavioral:  Positive for dysphoric mood and sleep disturbance. Negative for self-injury and suicidal ideas. The patient is nervous/anxious.      Objective:    Physical Exam Vitals and nursing note reviewed.  Constitutional:      General: She is not in acute distress.    Appearance: Normal appearance. She is well-developed. She is obese. She is not ill-appearing.  HENT:     Head: Normocephalic and atraumatic.     Right Ear: External ear normal.     Left Ear: External ear normal.     Nose: Nose normal.     Mouth/Throat:     Mouth: Mucous membranes are moist.     Pharynx: Oropharynx is clear.  Eyes:     Extraocular Movements: Extraocular movements intact.     Conjunctiva/sclera: Conjunctivae normal.     Pupils: Pupils are equal, round, and reactive to light.  Cardiovascular:     Rate and Rhythm: Normal rate and regular rhythm.     Pulses: Normal pulses.     Heart sounds: Normal heart sounds.  Pulmonary:     Effort: Pulmonary effort is normal.     Breath sounds: Normal breath sounds.  Abdominal:     General: Abdomen is flat.     Palpations: Abdomen is soft.     Tenderness: There is no abdominal tenderness.  Musculoskeletal:        General: Normal range of motion.     Cervical back: Normal range of motion and neck supple.  Skin:    General: Skin is warm and dry.  Neurological:     General: No focal deficit present.     Mental Status: She is alert and oriented to person, place, and time.  Psychiatric:        Mood and Affect: Mood normal.        Behavior:  Behavior normal.        Thought Content: Thought content normal.        Judgment: Judgment normal.    BP (!) 134/55 (BP Location: Left Arm, Patient Position: Sitting, Cuff Size: Large)   Pulse 80   Temp 98.2 F (36.8 C) (Oral)   Resp 18   Ht 5' (1.524 m)   Wt 280 lb (127 kg)   LMP 12/14/2020   SpO2 100%  BMI 54.68 kg/m  Wt Readings from Last 3 Encounters:  01/04/21 280 lb (127 kg)  12/11/20 279 lb 9.6 oz (126.8 kg)  10/23/20 275 lb 6.4 oz (124.9 kg)     Health Maintenance Due  Topic Date Due   Pneumococcal Vaccine 39-40 Years old (2 - PCV) 10/22/2017   COLONOSCOPY (Pts 45-63yrs Insurance coverage will need to be confirmed)  Never done   COVID-19 Vaccine (3 - Pfizer risk series) 12/13/2019   PAP SMEAR-Modifier  12/02/2020   INFLUENZA VACCINE  12/18/2020    There are no preventive care reminders to display for this patient.  Lab Results  Component Value Date   TSH 2.650 09/03/2019   Lab Results  Component Value Date   WBC 9.5 12/11/2020   HGB 12.4 12/11/2020   HCT 41.0 12/11/2020   MCV 89 12/11/2020   PLT 350 12/11/2020   Lab Results  Component Value Date   NA 142 12/11/2020   K 4.7 12/11/2020   CO2 21 12/11/2020   GLUCOSE 111 (H) 12/11/2020   BUN 8 12/11/2020   CREATININE 0.46 (L) 12/11/2020   BILITOT <0.2 12/11/2020   ALKPHOS 107 12/11/2020   AST 22 12/11/2020   ALT 29 12/11/2020   PROT 6.8 12/11/2020   ALBUMIN 3.6 (L) 12/11/2020   CALCIUM 8.5 (L) 12/11/2020   ANIONGAP 13 11/25/2013   EGFR 119 12/11/2020   Lab Results  Component Value Date   CHOL 184 12/11/2020   Lab Results  Component Value Date   HDL 46 12/11/2020   Lab Results  Component Value Date   LDLCALC 102 (H) 12/11/2020   Lab Results  Component Value Date   TRIG 208 (H) 12/11/2020   Lab Results  Component Value Date   CHOLHDL 4.0 12/11/2020   Lab Results  Component Value Date   HGBA1C 6.3 (A) 12/11/2020      Assessment & Plan:   Problem List Items Addressed This  Visit       Other   Morbid obesity with body mass index (BMI) of 50.0 to 59.9 in adult [facility])   Stress at home   Relevant Medications   hydrOXYzine (ATARAX/VISTARIL) 10 MG tablet   Atypical chest pain - Primary   Relevant Medications   omeprazole (PRILOSEC) 20 MG capsule   Psychophysiological insomnia   Relevant Medications   hydrOXYzine (ATARAX/VISTARIL) 10 MG tablet    Meds ordered this encounter  Medications   omeprazole (PRILOSEC) 20 MG capsule    Sig: Take 1 capsule (20 mg total) by mouth daily.    Dispense:  30 capsule    Refill:  3    Order Specific Question:   Supervising Provider    Answer:   Asencion Noble E [[id number]]   hydrOXYzine (ATARAX/VISTARIL) 10 MG tablet    Sig: Take 1 tablet (10 mg total) by mouth 3 (three) times daily as needed. Okay to take 2-3 tabs PO QHS PRN for insomnia    Dispense:  60 tablet    Refill:  0    Order Specific Question:   Supervising Provider    Answer:   Joya Gaskins, PATRICK E [[id number]]  1. Atypical chest pain Doubtful cardiac in nature, increased stress levels, encouraged patient to work on stress reducing exercises, trial omeprazole.  Increase hydration.  Red flags given for prompt reevaluation.  Patient to follow-up in mobile unit in 1 week. - omeprazole (PRILOSEC) 20 MG capsule; Take 1 capsule (20 mg total) by mouth daily.  Dispense: 30 capsule; Refill: 3  2. Psychophysiological insomnia Trial hydroxyzine 20 to 30 mg at bedtime for insomnia.  Patient education given on good sleep hygiene, strongly encouraged discontinuing daytime napping. - hydrOXYzine (ATARAX/VISTARIL) 10 MG tablet; Take 1 tablet (10 mg total) by mouth 3 (three) times daily as needed. Okay to take 2-3 tabs PO QHS PRN for insomnia  Dispense: 60 tablet; Refill: 0  3. Stress at home Trial hydroxyzine 10 mg every 8 hours as needed. - hydrOXYzine (ATARAX/VISTARIL) 10 MG tablet; Take 1 tablet (10 mg total) by mouth 3 (three) times daily as needed. Okay to take 2-3 tabs PO QHS PRN  for insomnia  Dispense: 60 tablet; Refill: 0  4. Morbid obesity with body mass index (BMI) of 50.0 to 59.9 in adult [facility])    I have reviewed the patient's medical history (PMH, PSH, Social History, Family History, Medications, and allergies) , and have been updated if relevant. I spent 30 minutes reviewing chart and  face to face time with patient.     Follow-up: Return in about 1 week (around 01/11/2021).    Loraine Grip Mayers, PA-C

## 2021-01-04 NOTE — Patient Instructions (Addendum)
You will start taking prilosec once daily to help with your chest pain  You will start taking hydroxyzine 10 mg every 8 hours to help with anxiety/stress and you will take 2-3 tabs at bedtime to help with sleep.  Do not nap during the day.    Make sure you are drinking lots of water.  Please return to the mobile unit in 1 week for follow-up.  Kennieth Rad, PA-C Physician Assistant [facility] [email or web address]    Control del estrs en los adultos Managing Stress, Adult Sentir un cierto nivel de estrs es normal. El estrs ayuda a que el cuerpo y la mente se preparen para enfrentar las exigencias de la vida. Las hormonas del estrs pueden motivarlo a hacer bien su trabajo y a cumplir con sus responsabilidades. Sin embargo, el estrs intenso o prolongado (crnico) puede Print production planner su salud mental y fsica. El estrs crnico aumenta el riesgo de sufrir ansiedad, depresin y otros problemas de [place], como problemas digestivos, dolores musculares, enfermedades cardacas, hipertensin arterial yaccidente cerebrovascular. Cules son las causas? Las causas frecuentes de estrs son las siguientes: Las exigencias del trabajo, como las fechas lmite, la sensacin de Fish farm manager en exceso o el trabajar muchas horas. Las Paramedic, como problemas de dinero, desacuerdos con un cnyuge o problemas en la crianza de los nios. Las presiones por [employer] en la vida, como un divorcio, [place], prdida de un ser querido o enfermedad crnica. Usted puede correr un riesgo ms alto de tener problemas relacionados con el estrs si no duerme lo suficiente, no tiene buena salud, no tiene apoyoemocional o tiene un trastorno de salud mental como ansiedad o depresin. Cmo reconocer el estrs El estrs puede hacer que usted: Tenga dificultad para dormir. Se sienta triste, ansioso, irritable o abrumado. Pierda el apetito. Coma en exceso o  tenga deseos de comer alimentos poco saludables. Tenga deseos de consumir drogas o alcohol. El estrs tambin puede causar sntomas fsicos, por ejemplo: Msculos tensos y doloridos, especialmente en los hombros y el cuello. Dolores de [place]. Dificultad para respirar. Frecuencia cardaca ms rpida. Dolor de [place], nuseas o vmitos. Diarrea o estreimiento. Dificultad para concentrarse. Siga estas instrucciones en su casa: Estilo de vida Identifique el origen del estrs y su reaccin a este. Consulte a un terapeuta que lo ayude a Scientist, clinical (histocompatibility and immunogenetics). Cuando se presenten acontecimientos estresantes: Hable de ello con su familia, amigos o compaeros de [place]. Trate de pensar de un modo realista los acontecimientos estresantes y de no ignorarlos ni Horticulturist, commercial. Intente encontrar los aspectos positivos en una situacin estresante y de no enfocarse en los negativos. Reduzca sus responsabilidades en el trabajo y en su casa, si es posible. Pida ayuda a sus amigos o familiares si la necesita. Encuentre formas de lidiar con Dealer, tales como: Meditacin. Respiracin profunda. Yoga o tai chi. Relajacin muscular progresiva. Hacer arte, tocar msica o leer. Hacerse tiempo para Data processing manager divertidas. Pasar tiempo con amigos y familiares. Recibir apoyo de familiares, amigos o recursos espirituales. Comida y bebida Siga una dieta saludable. Esto puede comprender lo siguiente: Consuma alimentos ricos en fibra, como frijoles, cereales integrales, y frutas y verduras frescas. Limite el consumo de alimentos ricos en grasas y azcares procesados, como alimentos fritos o dulces. No saltee comidas ni coma en exceso. Beba suficiente lquido como para Theatre manager la orina de color amarillo plido. Consumo de alcohol No beba alcohol si: Su mdico le indica no hacerlo. Est embarazada, puede estar embarazada  o est tratando de quedar embarazada. El consumo de alcohol  es una forma en la que algunas personas tratan de Public house manager el estrs. Esto puede ser peligroso, por lo que si bebe alcohol: Limite la cantidad que bebe: De 0 a 1 medida por da para las mujeres. De 0 a 2 medidas por da para los hombres. Est atento a la cantidad de alcohol que hay en las bebidas que toma. En los [place], una medida equivale a una botella de cerveza de 12 oz (355 ml), un vaso de vino de 5 oz (148 ml) o un vaso de una bebida alcohlica de alta graduacin de 1 oz (44 ml). Actividad  Incluya 30 minutos de ejercicio en su cronograma diario. El ejercicio es un buen recurso para reducir Dealer. Lloyd Huger en su da para realizar una actividad que le resulte relajante. Pruebe caminar, andar en bicicleta, leer un libro o escuchar msica. Programe su tiempo de una manera que reduzca el estrs y mantenga un cronograma uniforme. D prioridad a las tareas ms importantes.  Instrucciones generales [place] lo suficiente. Trate de irse a dormir y Holiday representative a la misma hora todos [place]. Use los medicamentos de venta libre y los recetados solamente como se lo haya indicado el mdico. No consuma ningn producto que contenga nicotina o tabaco, como cigarrillos, cigarrillos electrnicos y tabaco de Higher education careers adviser. Si necesita ayuda para dejar de fumar, consulte al mdico. No consuma drogas ni fume para sobrellevar el estrs. Concurra a todas las visitas de seguimiento como se lo haya indicado el mdico. Esto es importante. Dnde buscar apoyo Converse con el mdico sobre cmo controlar el estrs o buscar un grupo de apoyo. Busque un terapeuta para que trabaje con usted sobre las tcnicas de control del estrs. Comunquese con un mdico si: Sus sntomas de Geneticist, molecular. No puede controlar el estrs en su casa. Tiene dificultades para dejar de consumir drogas o alcohol. Solicite ayuda de inmediato si: Podra ser un peligro para usted mismo o para los dems. Tiene  cualquier pensamiento de muerte o suicidio. Si alguna vez siente que puede lastimarse o Market researcher a Producer, television/film/video, o tiene pensamientos de poner fin a su vida, busque ayuda de inmediato. Puede dirigirse al servicio de emergencias ms cercano o comunicarse con: Servicio de emergencias de su localidad (911 en EE. UU.). Una lnea de asistencia al suicida y Freight forwarder en crisis, como National Suicide Prevention Lifeline ([place]), al 548-620-1823. Est disponible las 24 horas del da. Resumen Sentir un cierto nivel de estrs es normal, pero el estrs intenso o prolongado (crnico) puede Print production planner su salud mental y fsica. El estrs crnico puede aumentar el riesgo de sufrir ansiedad, depresin y otros problemas de [place], como problemas digestivos, dolores musculares, enfermedades cardacas, hipertensin arterial y accidente cerebrovascular. Usted puede correr un riesgo ms alto de tener problemas relacionados con el estrs si no duerme lo suficiente, no tiene buena salud, carece de [employer] o tiene un trastorno de salud mental como ansiedad o depresin. Identifique el origen del estrs y su reaccin a este. Trate de [employer] acontecimientos estresantes con familiares, amigos o compaeros de [place], de Pension scheme manager un mtodo para sobrellevar la situacin o de obtener 69 de recursos espirituales. Si necesita ms ayuda, hable con el mdico para encontrar un grupo de apoyo o un terapeuta de salud mental. Esta informacin no tiene Marine scientist el consejo del mdico. Asegresede hacerle al mdico cualquier pregunta que tenga. Document  Revised: 01/20/2019 Document Reviewed: 01/20/2019 Elsevier Patient Education  2022 [place] Insomnia El insomnio es un trastorno del sueo que causa dificultades para conciliar el sueo o para [place]. Puede producir fatiga, falta de energa, dificultad para concentrarse, cambios en el estado de nimo y mal  rendimiento escolar olaboral. Hay tres formas diferentes de clasificar el insomnio: Dificultad para conciliar el sueo. Dificultad para mantener el sueo. Despertar muy precoz por la maana. Cualquier tipo de insomnio puede ser a Barrister's clerk (crnico) o a Control and instrumentation engineer (agudo). Ambos son frecuentes. Generalmente, el insomnio a corto plazo dura tres meses o menos tiempo. El crnico ocurre al menos tres veces por semana durantems de tres meses. Cules son las causas? El insomnio puede deberse a otra afeccin, situacin o sustancia, por ejemplo: Ansiedad. Ciertos medicamentos. Enfermedad de reflujo gastroesofgico (ERGE) u otras enfermedades gastrointestinales. Asma y otras enfermedades respiratorias. Sndrome de las piernas inquietas, apnea del sueo u otros trastornos del sueo. Dolor crnico. Menopausia. Accidente cerebrovascular. Consumo excesivo de alcohol, tabaco u drogas ilegales. Afecciones de salud mental, como depresin. Cafena. Trastornos neurolgicos, como enfermedad de Alzheimer. Hiperactividad de la glndula tiroidea (hipertiroidismo). En ocasiones, la causa del insomnio puede ser desconocida. Qu incrementa el riesgo? Los factores de riesgo de tener insomnio incluyen lo siguiente: Sexo. La enfermedad afecta ms a menudo a las mujeres que a los hombres. La edad. El insomnio es ms frecuente a medida que una persona envejece. Estrs. Falta de actividad fsica. Los horarios de trabajo irregulares o los turnos nocturnos. Los viajes a lugares de diferentes zonas horarias. Ciertas afecciones mdicas y de salud mental. Cules son los signos o sntomas? Si tiene insomnio, el sntoma principal es la dificultad para conciliar el sueo o mantenerlo. Esto puede derivar en otros sntomas, por ejemplo: Sentirse fatigado o tener poca energa. Ponerse nervioso por [employer] irse a dormir. No sentirse descansado por la maana. Tener dificultad para concentrarse. Sentirse irritable,  ansioso o deprimido. Cmo se diagnostica? Esta afeccin se puede diagnosticar en funcin de lo siguiente: Los sntomas y los antecedentes mdicos. El mdico puede hacerle preguntas sobre: Hbitos de sueo. Cualquier afeccin mdica que tenga. La salud mental. Un examen fsico. Cmo se trata? El tratamiento para el insomnio depende de la causa. El tratamiento puede centrarse en tratar Ardelia Mems afeccin preexistente que causa el insomnio. El tratamiento tambin puede incluir lo siguiente: Medicamentos que lo ayuden a dormir. Asesoramiento psicolgico o terapia. Ajustes en el estilo de vida para ayudarlo a dormir mejor. Siga estas instrucciones en su casa: Comida y bebida  Limite o evite el consumo de alcohol, bebidas con cafena y cigarrillos, especialmente cerca de la hora de [place], ya que pueden perturbarle el sueo. No consuma una comida suculenta ni coma alimentos condimentados justo antes de la hora de [place]. Esto puede causarle molestias digestivas y dificultades para dormir.  Hbitos de sueo  Lleve un registro del sueo ya que podra ser de utilidad para que usted y a su mdico puedan determinar qu podra estar causndole insomnio. Escriba los siguientes datos: Cundo duerme. Cundo se despierta durante la noche. Qu tan bien duerme. Qu tan relajado se siente al da siguiente. Cualquier efecto secundario de los medicamentos que toma. Lo que usted come y bebe. Convierta su habitacin en un lugar oscuro, cmodo donde sea fcil conciliar el sueo. Coloque persianas o cortinas oscuras que impidan la entrada de la luz del exterior. Para bloquear los ruidos, use un aparato que reproduzca sonidos ambientales o relajantes de fondo. Mantenga  baja la temperatura. Limite el uso de pantallas antes de la hora de [place]. Esto incluye: Watching TV. Usar el telfono inteligente, la tableta o la computadora. Siga una rutina que incluya ir a dormir y Clinical cytogeneticist a la misma [place] y noche. Esto puede ayudarlo a conciliar el sueo ms rpidamente. Considere realizar [employer] tranquila, como leer, e incorporarla como parte de la rutina a la hora de irse a dormir. Trate de evitar tomar siestas durante el da para que pueda dormir mejor por la noche. Levntese de la cama si sigue despierto despus de 15 minutos de haber intentado dormirse. Archie luces, pero intente leer o hacer una actividad tranquila. Cuando tenga sueo, regrese a Futures trader.  Instrucciones generales Use los medicamentos de venta libre y los recetados solamente como se lo haya indicado el mdico. Realice ejercicio con regularidad como se lo haya indicado el mdico. Evite la actividad fsica desde varias horas antes de irse a dormir. Utilice tcnicas de relajacin para controlar el estrs. Pdale al mdico que le sugiera algunas tcnicas que sean adecuadas para usted. Pueden incluir: Ejercicios de respiracin. Rutinas para aliviar la tensin muscular. Visualizacin de escenas apacibles. Conduzca con cuidado. No conduzca si est muy somnoliento. Concurra a todas las visitas de seguimiento como se lo haya indicado el mdico. Esto es importante. Comunquese con un mdico si: Est cansado durante todo Games developer. Tiene dificultad en su rutina diaria debido a la somnolencia. Sigue teniendo problemas para dormir o Press photographer. Solicite ayuda de inmediato si: Piensa seriamente en lastimarse a usted mismo o a Nurse, children's. Si alguna vez siente que puede lastimarse o Market researcher a Producer, television/film/video, o tiene pensamientos de poner fin a su vida, busque ayuda de inmediato. Puede dirigirse al servicio de emergencias ms cercano o comunicarse con: Servicio de emergencias de su localidad (911 en EE. UU.). Una lnea de asistencia al suicida y atencin en crisis como National Suicide Prevention Lifeline ([place]), al [phone]. Est disponible las 24 horas del  da. Resumen El insomnio es un trastorno del sueo que causa dificultades para conciliar el sueo o para [place]. El insomnio puede ser a Barrister's clerk (crnico) o a Control and instrumentation engineer (agudo). El tratamiento para el insomnio depende de la causa. El tratamiento puede centrarse en tratar Ardelia Mems afeccin preexistente que causa el insomnio. Lleve un registro del sueo ya que podra ser de utilidad para que usted y a su mdico puedan determinar qu podra estar causndole insomnio. Esta informacin no tiene Marine scientist el consejo del mdico. Asegresede hacerle al mdico cualquier pregunta que tenga. Document Revised: 03/20/2020 Document Reviewed: 03/20/2020 Elsevier Patient Education  2022 [employer].

## 2021-01-04 NOTE — Progress Notes (Signed)
Patient has eaten ice-cream only today. Patient has not taken any medication today due to not being at home. Patient reports pain in the HA for the past 3 weeks located in the whole head. Patient reports ibuprofen does not help with the pan. Patient reports chest pain beginning 3 weeks ago also. Patient shares 3 weeks ago she received a call that her son is in the hospital in [place] and since she has had the HA, chest pain, unable to sleep and no appetite due to stressing about the situation. Patient shares pain is throughout the day.

## 2021-01-11 ENCOUNTER — Other Ambulatory Visit: Payer: Self-pay

## 2021-01-23 ENCOUNTER — Ambulatory Visit (INDEPENDENT_AMBULATORY_CARE_PROVIDER_SITE_OTHER): Payer: Self-pay | Admitting: Primary Care

## 2021-02-13 ENCOUNTER — Ambulatory Visit (INDEPENDENT_AMBULATORY_CARE_PROVIDER_SITE_OTHER): Payer: Self-pay | Admitting: Primary Care

## 2021-02-13 ENCOUNTER — Other Ambulatory Visit: Payer: Self-pay

## 2021-02-13 ENCOUNTER — Encounter (INDEPENDENT_AMBULATORY_CARE_PROVIDER_SITE_OTHER): Payer: Self-pay | Admitting: Primary Care

## 2021-02-13 VITALS — BP 113/68 | HR 69 | Temp 97.7°F | Ht 59.0 in | Wt 281.8 lb

## 2021-02-13 DIAGNOSIS — S20111A Abrasion of breast, right breast, initial encounter: Secondary | ICD-10-CM

## 2021-02-13 DIAGNOSIS — Z1231 Encounter for screening mammogram for malignant neoplasm of breast: Secondary | ICD-10-CM

## 2021-02-13 DIAGNOSIS — Z124 Encounter for screening for malignant neoplasm of cervix: Secondary | ICD-10-CM

## 2021-02-13 DIAGNOSIS — Z23 Encounter for immunization: Secondary | ICD-10-CM

## 2021-02-13 NOTE — Progress Notes (Signed)
Mackenzie Ford    HPI Mackenzie Ford 45 y.o.Hispanic (Interpreter: Anner Crete [phone] )sever morbid obese female presents for an acute visit a bump on her right breast. She first noticed it 3 months but the bump/mass she tried to squeeze it and nothing came out. Then self resolve and return 3 weeks ago. Denies pain, fever, warmth, chills , size and color unchanged.   Past Medical History:  Diagnosis Date   Hyperlipidemia 2017   Microalbuminuria 02/10/2017   Morbid obesity with body mass index (BMI) of 50.0 to 59.9 in adult [facility]) 2016   Type 2 diabetes mellitus with hyperglycemia, without long-term current use of insulin ([place]) 05/21/2007     No Known Allergies    Current Outpatient Medications on File Prior to Visit  Medication Sig Dispense Refill   atorvastatin (LIPITOR) 20 MG tablet TAKE 1 TABLET (20 MG TOTAL) BY MOUTH DAILY. 90 tablet 1   Blood Glucose Monitoring Suppl (AGAMATRIX PRESTO) w/Device KIT Check sugars twice daily before meals 1 kit 0   Dulaglutide 0.75 MG/0.5ML SOPN INJECT 0.75 MG INTO THE SKIN ONCE A WEEK. 2 mL 11   glucose blood (AGAMATRIX PRESTO TEST) test strip Check sugars twice daily before meals 100 each 12   hydrOXYzine (ATARAX/VISTARIL) 10 MG tablet Take 1 tablet (10 mg total) by mouth 3 (three) times daily as needed. Okay to take 2-3 tabs PO QHS PRN for insomnia 60 tablet 0   ibuprofen (ADVIL) 800 MG tablet Take 1 tablet (800 mg total) by mouth every 8 (eight) hours as needed. 90 tablet 1   lisinopril (ZESTRIL) 5 MG tablet TAKE 1 TABLET (5 MG TOTAL) BY MOUTH DAILY. 90 tablet 3   metFORMIN (GLUCOPHAGE) 1000 MG tablet TAKE 1 TABLET BY MOUTH TWICE DAILY WITH A MEAL. 180 tablet 1   omeprazole (PRILOSEC) 20 MG capsule Take 1 capsule (20 mg total) by mouth daily. 30 capsule 3   No current facility-administered medications on file prior to visit.    ROS: all negative except above.   Physical Exam: Filed Weights   02/13/21 1034  Weight: 281 lb  12.8 oz (127.8 kg)   BP 113/68 (BP Location: Right Arm, Patient Position: Sitting, Cuff Size: Large)   Pulse 69   Temp 97.7 F (36.5 C) (Temporal)   Ht _0  (1.499 m)   Wt 281 lb 12.8 oz (127.8 kg)   SpO2 93%   BMI 56.92 kg/m  General Appearance: sever morbid obese female, Well nourished, in no apparent distress. Eyes: PERRLA, EOMs, conjunctiva no swelling or erythema Sinuses: No Frontal/maxillary tenderness ENT/Mouth: Ext aud canals clear, TMs without erythema.  Hearing normal.  Neck: Supple, thyroid normal.  Respiratory: Respiratory effort normal, BS equal bilaterally without rales, rhonchi, wheezing or stridor.  Cardio: RRR with no MRGs. Brisk peripheral pulses without edema.  Abdomen: Soft, + BS.  Non tender, no guarding, rebound, hernias, masses. Lymphatics: Non tender without lymphadenopathy.  Musculoskeletal: Full ROM, 5/5 strength, normal gait.  Skin: Warm, dry without rashes, lesions, ecchymosis.  Neuro: Cranial nerves intact. Normal muscle tone, no cerebellar symptoms. Sensation intact.  Psych: Awake and oriented X 3, normal affect, Insight and Judgment appropriate.    Stormi was seen today for mass.  Diagnoses and all orders for this visit:  Need for immunization against influenza -     Flu Vaccine QUAD 44moIM (Fluarix, Fluzone & Alfiuria Quad PF)  Abrasion of right breast, initial encounter  Will monitor no tenderness discoloration no palpable mast/nodule . Patient voiced  concern. Image taken to make comparisons.    Encounter for screening mammogram for malignant neoplasm of breast Patient completed application for BCCP while in clinic and application has and faxed to [facility]. Patient aware that [facility] will contact her directly to schedule appointment.  -     MM Digital Screening; Future  Cervical cancer screening  Patient completed application for BCCP while in clinic and application has and faxed to [facility]. Patient aware that [facility] will contact her directly to  schedule appointment.   Mackenzie Perna, NP 11:04 AM

## 2021-02-19 ENCOUNTER — Encounter (INDEPENDENT_AMBULATORY_CARE_PROVIDER_SITE_OTHER): Payer: Self-pay | Admitting: Primary Care

## 2021-02-26 ENCOUNTER — Other Ambulatory Visit: Payer: Self-pay | Admitting: Primary Care

## 2021-02-26 DIAGNOSIS — Z1231 Encounter for screening mammogram for malignant neoplasm of breast: Secondary | ICD-10-CM

## 2021-03-02 ENCOUNTER — Other Ambulatory Visit: Payer: Self-pay

## 2021-03-07 ENCOUNTER — Other Ambulatory Visit: Payer: Self-pay

## 2021-03-26 ENCOUNTER — Other Ambulatory Visit: Payer: Self-pay

## 2021-03-26 ENCOUNTER — Encounter (INDEPENDENT_AMBULATORY_CARE_PROVIDER_SITE_OTHER): Payer: Self-pay | Admitting: Primary Care

## 2021-03-26 ENCOUNTER — Ambulatory Visit (INDEPENDENT_AMBULATORY_CARE_PROVIDER_SITE_OTHER): Payer: Self-pay | Admitting: Primary Care

## 2021-03-26 DIAGNOSIS — K0889 Other specified disorders of teeth and supporting structures: Secondary | ICD-10-CM

## 2021-03-26 NOTE — Progress Notes (Signed)
Dental pain x several months Pain  6 /10  Dental referral   InterpreterLars Mage- [phone]

## 2021-03-26 NOTE — Progress Notes (Signed)
[place]  Virtual Visit via Telephone Note  I connected with Mackenzie Ford, on 03/26/2021 at 3:51 PM through  telephone and verified that I am speaking with the correct person using two identifiers.   Consent: I discussed the limitations, risks, security and privacy concerns of performing an evaluation and management service by telephone and the availability of in person appointments. I also discussed with the patient that there may be a patient responsible charge related to this service. The patient expressed understanding and agreed to proceed.   Location of Patient: home  Location of Provider: [facility]   Persons participating in Telemedicine visit: Mackenzie Ford Mackenzie Mire,  NP Mackenzie Ford , CMA  History of Present Illness: Ms.Mackenzie Ford is a 45 year old Hispanic female ([phone]) c/o dental pain and rates her pain 6/10 with sensivity to temperature change for  several months  Reguesting  a Dental referral  Past Medical History:  Diagnosis Date   Hyperlipidemia 2017   Microalbuminuria 02/10/2017   Morbid obesity with body mass index (BMI) of 50.0 to 59.9 in adult [facility]) 2016   Type 2 diabetes mellitus with hyperglycemia, without long-term current use of insulin ([place]) 05/21/2007   No Known Allergies  Current Outpatient Medications on File Prior to Visit  Medication Sig Dispense Refill   atorvastatin (LIPITOR) 20 MG tablet TAKE 1 TABLET (20 MG TOTAL) BY MOUTH DAILY. 90 tablet 1   Dulaglutide 0.75 MG/0.5ML SOPN INJECT 0.75 MG INTO THE SKIN ONCE A WEEK. 2 mL 11   ibuprofen (ADVIL) 800 MG tablet Take 1 tablet (800 mg total) by mouth every 8 (eight) hours as needed. 90 tablet 1   lisinopril (ZESTRIL) 5 MG tablet TAKE 1 TABLET (5 MG TOTAL) BY MOUTH DAILY. 90 tablet 3   metFORMIN (GLUCOPHAGE) 1000 MG tablet TAKE 1 TABLET BY MOUTH TWICE DAILY WITH A MEAL. 180  tablet 1   Blood Glucose Monitoring Suppl (AGAMATRIX PRESTO) w/Device KIT Check sugars twice daily before meals 1 kit 0   glucose blood (AGAMATRIX PRESTO TEST) test strip Check sugars twice daily before meals 100 each 12   hydrOXYzine (ATARAX/VISTARIL) 10 MG tablet Take 1 tablet (10 mg total) by mouth 3 (three) times daily as needed. Okay to take 2-3 tabs PO QHS PRN for insomnia (Patient not taking: Reported on 03/26/2021) 60 tablet 0   omeprazole (PRILOSEC) 20 MG capsule Take 1 capsule (20 mg total) by mouth daily. (Patient not taking: Reported on 03/26/2021) 30 capsule 3   No current facility-administered medications on file prior to visit.    Observations/Objective: .LMP 02/22/2021  . Comprehensive ROS noted positive in HPI  Assessment and Plan: Mackenzie Ford was seen today for dental pain.  Diagnoses and all orders for this visit:  Pain, dental -     Ambulatory referral to Dentistry    Follow Up Instructions: For schedule DM appt   I discussed the assessment and treatment plan with the patient. The patient was provided an opportunity to ask questions and all were answered. The patient agreed with the plan and demonstrated an understanding of the instructions.   The patient was advised to call back or seek an in-person evaluation if the symptoms worsen or if the condition fails to improve as anticipated.     I provided 10 minutes total of non-face-to-face time during this encounter including median intraservice time, reviewing previous notes, investigations, ordering medications, medical decision making, coordinating care  and patient verbalized understanding at the end of the visit.    This note has been created with Surveyor, quantity. Any transcriptional errors are unintentional.   Mackenzie Perna, NP 03/26/2021, 3:51 PM

## 2021-03-28 ENCOUNTER — Other Ambulatory Visit: Payer: Self-pay

## 2021-03-28 ENCOUNTER — Ambulatory Visit: Payer: Self-pay | Attending: Primary Care

## 2021-03-30 ENCOUNTER — Other Ambulatory Visit: Payer: Self-pay

## 2021-04-02 ENCOUNTER — Other Ambulatory Visit: Payer: Self-pay

## 2021-04-05 ENCOUNTER — Ambulatory Visit: Payer: Self-pay

## 2021-04-17 ENCOUNTER — Ambulatory Visit: Payer: Self-pay

## 2021-06-06 ENCOUNTER — Other Ambulatory Visit: Payer: Self-pay

## 2021-06-13 ENCOUNTER — Ambulatory Visit (INDEPENDENT_AMBULATORY_CARE_PROVIDER_SITE_OTHER): Payer: Self-pay | Admitting: Primary Care

## 2021-06-13 ENCOUNTER — Encounter (INDEPENDENT_AMBULATORY_CARE_PROVIDER_SITE_OTHER): Payer: Self-pay | Admitting: Primary Care

## 2021-06-13 ENCOUNTER — Other Ambulatory Visit: Payer: Self-pay

## 2021-06-13 VITALS — BP 124/78 | HR 64 | Temp 97.7°F | Ht 59.0 in | Wt 279.6 lb

## 2021-06-13 DIAGNOSIS — I10 Essential (primary) hypertension: Secondary | ICD-10-CM

## 2021-06-13 DIAGNOSIS — E782 Mixed hyperlipidemia: Secondary | ICD-10-CM

## 2021-06-13 DIAGNOSIS — E119 Type 2 diabetes mellitus without complications: Secondary | ICD-10-CM

## 2021-06-13 LAB — POCT GLYCOSYLATED HEMOGLOBIN (HGB A1C): Hemoglobin A1C: 7.1 % — AB (ref 4.0–5.6)

## 2021-06-13 MED ORDER — TRULICITY 1.5 MG/0.5ML ~~LOC~~ SOAJ
1.5000 mg | SUBCUTANEOUS | 6 refills | Status: DC
  Filled 2021-06-13: qty 2, 28d supply, fill #0

## 2021-06-13 NOTE — Progress Notes (Signed)
Pt has only eaten grapes  Denies pain

## 2021-06-13 NOTE — Progress Notes (Signed)
Subjective:  Patient ID: Mackenzie Ford, female    DOB: 1975/12/25  Age: 46 y.o. 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 Patient does check blood sugar at home. Patient has No headache, No chest pain, No abdominal pain - No Nausea, No new weakness tingling or numbness, No Cough - SOB.  Compliant with meds - Yes Checking CBGs? Yes  Fasting avg - 109-120  Postprandial average -  Exercising regularly? - Yes Watching carbohydrate intake? - Yes Neuropathy ? - No Hypoglycemic events - No  - Recovers with :   Pertinent ROS:  Polyuria - No Polydipsia - No Vision problems - No  Medications as noted below. Taking them regularly without complication/adverse reaction being reported today.   History Mackenzie Ford has a past medical history of Hyperlipidemia (2017), Microalbuminuria (02/10/2017), Morbid obesity with body mass index (BMI) of 50.0 to 59.9 in adult [facility]) (2016), and Type 2 diabetes mellitus with hyperglycemia, without long-term current use of insulin ([place]) (05/21/2007).   She has a past surgical history that includes Cesarean section; Cesarean section; Cesarean section; and Tubal ligation (06/28/2007).   Her family history includes Asthma in her son; Cancer (age of onset: 73) in her mother; Diabetes in her mother; HIV/AIDS in her brother.She reports that she has never smoked. She has never used smokeless tobacco. She reports that she does not drink alcohol and does not use drugs.  Current Outpatient Medications on File Prior to Visit  Medication Sig Dispense Refill   atorvastatin (LIPITOR) 20 MG tablet TAKE 1 TABLET (20 MG TOTAL) BY MOUTH DAILY. 90 tablet 1   Blood Glucose Monitoring Suppl (AGAMATRIX PRESTO) w/Device KIT Check sugars twice daily before meals 1 kit 0   glucose blood (AGAMATRIX PRESTO TEST) test strip Check sugars twice daily before meals 100 each 12    lisinopril (ZESTRIL) 5 MG tablet TAKE 1 TABLET (5 MG TOTAL) BY MOUTH DAILY. 90 tablet 3   metFORMIN (GLUCOPHAGE) 1000 MG tablet TAKE 1 TABLET BY MOUTH TWICE DAILY WITH A MEAL. 180 tablet 1   ibuprofen (ADVIL) 800 MG tablet Take 1 tablet (800 mg total) by mouth every 8 (eight) hours as needed. (Patient not taking: Reported on 06/13/2021) 90 tablet 1   omeprazole (PRILOSEC) 20 MG capsule Take 1 capsule (20 mg total) by mouth daily. (Patient not taking: Reported on 03/26/2021) 30 capsule 3   No current facility-administered medications on file prior to visit.    ROS Comprehensive ROS Pertinent positive and negative noted in HPI    Objective:  BP 124/78 (BP Location: Right Arm, Patient Position: Sitting, Cuff Size: Large)    Pulse 64    Temp 97.7 F (36.5 C) (Oral)    Ht 4' 11" (1.499 m)    Wt 279 lb 9.6 oz (126.8 kg)    LMP 06/11/2021 (Exact Date)    SpO2 95%    BMI 56.47 kg/m   BP Readings from Last 3 Encounters:  06/13/21 124/78  02/13/21 113/68  01/04/21 (!) 134/55    Wt Readings from Last 3 Encounters:  06/13/21 279 lb 9.6 oz (126.8 kg)  02/13/21 281 lb 12.8 oz (127.8 kg)  01/04/21 280 lb (127 kg)   Physical exam: General: Vital signs reviewed.  Patient is well-developed and well-nourished, serve morbid obesity in no acute distress and cooperative with exam. Head: Normocephalic and atraumatic. Eyes: EOMI, conjunctivae  normal, no scleral icterus. Neck: Supple, trachea midline, normal ROM, no JVD, masses, thyromegaly, or carotid bruit present. Cardiovascular: RRR, S1 normal, S2 normal, no murmurs, gallops, or rubs. Pulmonary/Chest: Clear to auscultation bilaterally, no wheezes, rales, or rhonchi. Abdominal: Soft, non-tender, non-distended, BS +, no masses, organomegaly, or guarding present. Musculoskeletal: No joint deformities, erythema, or stiffness, ROM full and nontender. Extremities: No lower extremity edema bilaterally,  pulses symmetric and intact bilaterally. No cyanosis  or clubbing. Neurological: A&O x3, Strength is normal Skin: Warm, dry and intact. No rashes or erythema. Psychiatric: Normal mood and affect. speech and behavior is normal. Cognition and memory are normal.     Lab Results  Component Value Date   HGBA1C 7.1 (A) 06/13/2021   HGBA1C 6.3 (A) 12/11/2020   HGBA1C 6.3 (A) 09/11/2020    Lab Results  Component Value Date   WBC 10.5 06/13/2021   HGB 13.1 06/13/2021   HCT 40.5 06/13/2021   PLT 401 06/13/2021   GLUCOSE 114 (H) 06/13/2021   CHOL 151 06/13/2021   TRIG 176 (H) 06/13/2021   HDL 47 06/13/2021   LDLCALC 74 06/13/2021   ALT 35 (H) 06/13/2021   AST 37 06/13/2021   NA 141 06/13/2021   K 4.3 06/13/2021   CL 103 06/13/2021   CREATININE 0.63 06/13/2021   BUN 8 06/13/2021   CO2 22 06/13/2021   TSH 2.650 09/03/2019   HGBA1C 7.1 (A) 06/13/2021     Assessment & Plan:   Mackenzie Ford was seen today for diabetes.  Diagnoses and all orders for this visit:  Type 2 diabetes mellitus without complication, without long-term current use of insulin (HCC) -     HgB A1c 7.1 previously 6.3 - note holiday celebration back to back, birthdays ect  Increase Trulicity from .69m to 1.538m.weekly  Monitor foods that are high in carbohydrates are the following rice, potatoes, breads, sugars, and pastas.  Reduction in the intake (eating) will assist in lowering your blood sugars. -     CBC with Differential -     Ambulatory referral to Optometry  Mixed hyperlipidemia  Healthy lifestyle diet of fruits vegetables fish nuts whole grains and low saturated fat . Foods high in cholesterol or liver, fatty meats,cheese, butter avocados, nuts and seeds, chocolate and fried foods.    -     Lipid Panel  Essential hypertension Blood pressure goal met  of less than 130/80, low-sodium, DASH diet, medication compliance, 150 minutes of moderate intensity exercise per week. Discussed medication compliance, adverse effects. Lisinopril 8m62mrenal protection -      CMP14+EGFR  Other orders -     Dulaglutide (TRULICITY) 1.5 MG/KJ/1.7HXPN; Inject 1.5 mg into the skin once a week.    I have discontinued Mackenzie Ford's Dulaglutide and hydrOXYzine. I am also having her start on Trulicity. Additionally, I am having her maintain her AgaMatrix Presto, glucose blood, ibuprofen, atorvastatin, lisinopril, metFORMIN, and omeprazole.  Meds ordered this encounter  Medications   Dulaglutide (TRULICITY) 1.5 MG/TA/5.6PVPN    Sig: Inject 1.5 mg into the skin once a week.    Dispense:  0.5 mL    Refill:  6     Follow-up:   Return in about 3 months (around 09/11/2021) for DM.  The above assessment and management plan was discussed with the patient. The patient verbalized understanding of and has agreed to the management plan. Patient is aware to call the clinic if symptoms fail to improve or worsen. Patient is aware when  to return to the clinic for a follow-up visit. Patient educated on when it is appropriate to go to the emergency department.   [name], NP-C

## 2021-06-14 LAB — CMP14+EGFR
ALT: 35 IU/L — ABNORMAL HIGH (ref 0–32)
AST: 37 IU/L (ref 0–40)
Albumin/Globulin Ratio: 1.3 (ref 1.2–2.2)
Albumin: 4.1 g/dL (ref 3.8–4.8)
Alkaline Phosphatase: 116 IU/L (ref 44–121)
BUN/Creatinine Ratio: 13 (ref 9–23)
BUN: 8 mg/dL (ref 6–24)
Bilirubin Total: 0.3 mg/dL (ref 0.0–1.2)
CO2: 22 mmol/L (ref 20–29)
Calcium: 9.5 mg/dL (ref 8.7–10.2)
Chloride: 103 mmol/L (ref 96–106)
Creatinine, Ser: 0.63 mg/dL (ref 0.57–1.00)
Globulin, Total: 3.1 g/dL (ref 1.5–4.5)
Glucose: 114 mg/dL — ABNORMAL HIGH (ref 70–99)
Potassium: 4.3 mmol/L (ref 3.5–5.2)
Sodium: 141 mmol/L (ref 134–144)
Total Protein: 7.2 g/dL (ref 6.0–8.5)
eGFR: 110 mL/min/{1.73_m2} (ref >59)

## 2021-06-14 LAB — CBC WITH DIFFERENTIAL/PLATELET
Basophils Absolute: 0.1 10*3/uL (ref 0.0–0.2)
Basos: 1 %
EOS (ABSOLUTE): 0.3 10*3/uL (ref 0.0–0.4)
Eos: 2 %
Hematocrit: 40.5 % (ref 34.0–46.6)
Hemoglobin: 13.1 g/dL (ref 11.1–15.9)
Immature Grans (Abs): 0 10*3/uL (ref 0.0–0.1)
Immature Granulocytes: 0 %
Lymphocytes Absolute: 3.2 10*3/uL — ABNORMAL HIGH (ref 0.7–3.1)
Lymphs: 30 %
MCH: 27.9 pg (ref 26.6–33.0)
MCHC: 32.3 g/dL (ref 31.5–35.7)
MCV: 86 fL (ref 79–97)
Monocytes Absolute: 0.6 10*3/uL (ref 0.1–0.9)
Monocytes: 6 %
Neutrophils Absolute: 6.3 10*3/uL (ref 1.4–7.0)
Neutrophils: 61 %
Platelets: 401 10*3/uL (ref 150–450)
RBC: 4.7 x10E6/uL (ref 3.77–5.28)
RDW: 12.2 % (ref 11.7–15.4)
WBC: 10.5 10*3/uL (ref 3.4–10.8)

## 2021-06-14 LAB — LIPID PANEL
Chol/HDL Ratio: 3.2 ratio (ref 0.0–4.4)
Cholesterol, Total: 151 mg/dL (ref 100–199)
HDL: 47 mg/dL (ref >39)
LDL Chol Calc (NIH): 74 mg/dL (ref 0–99)
Triglycerides: 176 mg/dL — ABNORMAL HIGH (ref 0–149)
VLDL Cholesterol Cal: 30 mg/dL (ref 5–40)

## 2021-06-20 ENCOUNTER — Other Ambulatory Visit: Payer: Self-pay

## 2021-06-22 ENCOUNTER — Other Ambulatory Visit (INDEPENDENT_AMBULATORY_CARE_PROVIDER_SITE_OTHER): Payer: Self-pay | Admitting: Primary Care

## 2021-06-22 ENCOUNTER — Other Ambulatory Visit: Payer: Self-pay

## 2021-06-22 DIAGNOSIS — Z760 Encounter for issue of repeat prescription: Secondary | ICD-10-CM

## 2021-06-22 DIAGNOSIS — E782 Mixed hyperlipidemia: Secondary | ICD-10-CM

## 2021-06-22 MED ORDER — ATORVASTATIN CALCIUM 20 MG PO TABS
ORAL_TABLET | Freq: Every day | ORAL | 1 refills | Status: DC
  Filled 2021-06-22: qty 90, fill #0

## 2021-06-29 ENCOUNTER — Encounter (INDEPENDENT_AMBULATORY_CARE_PROVIDER_SITE_OTHER): Payer: Self-pay

## 2021-09-11 ENCOUNTER — Encounter (INDEPENDENT_AMBULATORY_CARE_PROVIDER_SITE_OTHER): Payer: Self-pay | Admitting: Primary Care

## 2021-09-11 ENCOUNTER — Other Ambulatory Visit: Payer: Self-pay

## 2021-09-11 ENCOUNTER — Ambulatory Visit (INDEPENDENT_AMBULATORY_CARE_PROVIDER_SITE_OTHER): Payer: Self-pay | Admitting: Primary Care

## 2021-09-11 VITALS — BP 118/76 | HR 69 | Temp 98.0°F | Ht 59.0 in | Wt 281.6 lb

## 2021-09-11 DIAGNOSIS — E1165 Type 2 diabetes mellitus with hyperglycemia: Secondary | ICD-10-CM

## 2021-09-11 DIAGNOSIS — E119 Type 2 diabetes mellitus without complications: Secondary | ICD-10-CM

## 2021-09-11 DIAGNOSIS — I10 Essential (primary) hypertension: Secondary | ICD-10-CM

## 2021-09-11 DIAGNOSIS — E782 Mixed hyperlipidemia: Secondary | ICD-10-CM

## 2021-09-11 DIAGNOSIS — Z760 Encounter for issue of repeat prescription: Secondary | ICD-10-CM

## 2021-09-11 LAB — POCT GLYCOSYLATED HEMOGLOBIN (HGB A1C): Hemoglobin A1C: 6.8 % — AB (ref 4.0–5.6)

## 2021-09-11 MED ORDER — TRULICITY 1.5 MG/0.5ML ~~LOC~~ SOAJ
1.5000 mg | SUBCUTANEOUS | 6 refills | Status: DC
  Filled 2021-09-11 – 2021-09-21 (×2): qty 2, 28d supply, fill #0
  Filled 2021-12-05: qty 2, 28d supply, fill #1

## 2021-09-11 MED ORDER — LISINOPRIL 5 MG PO TABS
ORAL_TABLET | Freq: Every day | ORAL | 3 refills | Status: DC
  Filled 2021-09-11: qty 90, 90d supply, fill #0
  Filled 2021-09-21: qty 30, 30d supply, fill #0
  Filled 2021-12-05: qty 30, 30d supply, fill #1

## 2021-09-11 MED ORDER — METFORMIN HCL 1000 MG PO TABS
ORAL_TABLET | Freq: Two times a day (BID) | ORAL | 1 refills | Status: DC
  Filled 2021-09-11: qty 180, 90d supply, fill #0
  Filled 2021-09-21: qty 60, 30d supply, fill #0
  Filled 2021-12-05: qty 60, 30d supply, fill #1

## 2021-09-11 MED ORDER — ATORVASTATIN CALCIUM 20 MG PO TABS
ORAL_TABLET | Freq: Every day | ORAL | 1 refills | Status: DC
  Filled 2021-09-11: qty 90, 90d supply, fill #0
  Filled 2021-09-21: qty 30, 30d supply, fill #0
  Filled 2021-12-05: qty 30, 30d supply, fill #1

## 2021-09-11 NOTE — Progress Notes (Signed)
? ?Subjective:  ?Patient ID: Mackenzie Ford, female    DOB: 1976-03-28  Age: 46 y.o. MRN: [id number] ? ?CC: Follow-up (Diabetes/) ?Fuller Song [id number] ? ?HPI ?DARWIN GUASTELLA presents for follow-up of diabetes. Patient does check blood sugar at home ? ?Compliant with meds - Yes ?Checking CBGs? Yes ? Fasting avg - 110-125 ? Postprandial average -  ?Exercising regularly? - Yes ?Watching carbohydrate intake? - Yes ?Neuropathy ? - No ?Hypoglycemic events - No ? - Recovers with :  ? ?Pertinent ROS:  ?Polyuria - No ?Polydipsia - No ?Vision problems - No ? ?Medications as noted below. Taking them regularly without complication/adverse reaction being reported today.  ? ?History ?Mackenzie Ford has a past medical history of Hyperlipidemia (2017), Microalbuminuria (02/10/2017), Morbid obesity with body mass index (BMI) of 50.0 to 59.9 in adult [facility]) (2016), and Type 2 diabetes mellitus with hyperglycemia, without long-term current use of insulin ([place]) (05/21/2007).  ? ?She has a past surgical history that includes Cesarean section; Cesarean section; Cesarean section; and Tubal ligation (06/28/2007).  ? ?Her family history includes Asthma in her son; Cancer (age of onset: 86) in her mother; Diabetes in her mother; HIV/AIDS in her brother.She reports that she has never smoked. She has never used smokeless tobacco. She reports that she does not drink alcohol and does not use drugs. ? ?Current Outpatient Medications on File Prior to Visit  ?Medication Sig Dispense Refill  ? Blood Glucose Monitoring Suppl (AGAMATRIX PRESTO) w/Device KIT Check sugars twice daily before meals 1 kit 0  ? glucose blood (AGAMATRIX PRESTO TEST) test strip Check sugars twice daily before meals 100 each 12  ? omeprazole (PRILOSEC) 20 MG capsule Take 1 capsule (20 mg total) by mouth daily. (Patient not taking: Reported on 03/26/2021) 30 capsule 3  ? ?No current facility-administered medications on file prior to visit.  ? ? ?ROS ?Comprehensive ROS Pertinent positive  and negative noted in HPI   ? ?Objective:  ?BP 118/76 (BP Location: Right Arm, Patient Position: Sitting, Cuff Size: Large)   Pulse 69   Temp 98 ?F (36.7 ?C) (Oral)   Ht _0  (1.499 m)   Wt 281 lb 9.6 oz (127.7 kg)   SpO2 95%   BMI 56.88 kg/m?  ? ?BP Readings from Last 3 Encounters:  ?09/11/21 118/76  ?06/13/21 124/78  ?02/13/21 113/68  ? ? ?Wt Readings from Last 3 Encounters:  ?09/11/21 281 lb 9.6 oz (127.7 kg)  ?06/13/21 279 lb 9.6 oz (126.8 kg)  ?02/13/21 281 lb 12.8 oz (127.8 kg)  ? ? ?Physical Exam ?Vitals reviewed.  ?Constitutional:   ?   Appearance: She is obese.  ?   Comments: Morbid obese  ?HENT:  ?   Head: Normocephalic.  ?   Right Ear: Tympanic membrane and external ear normal.  ?   Left Ear: Tympanic membrane and external ear normal.  ?   Nose: Nose normal.  ?Eyes:  ?   Extraocular Movements: Extraocular movements intact.  ?   Conjunctiva/sclera: Conjunctivae normal.  ?   Pupils: Pupils are equal, round, and reactive to light.  ?Cardiovascular:  ?   Rate and Rhythm: Normal rate and regular rhythm.  ?Pulmonary:  ?   Effort: Pulmonary effort is normal.  ?   Breath sounds: Normal breath sounds.  ?Abdominal:  ?   General: Bowel sounds are normal. There is distension.  ?Musculoskeletal:     ?   General: Normal range of motion.  ?   Cervical back: Normal range of motion  and neck supple.  ?Skin: ?   General: Skin is warm and dry.  ?Neurological:  ?   Mental Status: She is alert and oriented to person, place, and time.  ?Psychiatric:     ?   Mood and Affect: Mood normal.     ?   Behavior: Behavior normal.     ?   Thought Content: Thought content normal.     ?   Judgment: Judgment normal.  ? ?Lab Results  ?Component Value Date  ? HGBA1C 6.8 (A) 09/11/2021  ? HGBA1C 7.1 (A) 06/13/2021  ? HGBA1C 6.3 (A) 12/11/2020  ? ? ?Lab Results  ?Component Value Date  ? WBC 10.5 06/13/2021  ? HGB 13.1 06/13/2021  ? HCT 40.5 06/13/2021  ? PLT 401 06/13/2021  ? GLUCOSE 114 (H) 06/13/2021  ? CHOL 151 06/13/2021  ? TRIG 176  (H) 06/13/2021  ? HDL 47 06/13/2021  ? [place] 74 06/13/2021  ? ALT 35 (H) 06/13/2021  ? AST 37 06/13/2021  ? NA 141 06/13/2021  ? K 4.3 06/13/2021  ? CL 103 06/13/2021  ? CREATININE 0.63 06/13/2021  ? BUN 8 06/13/2021  ? CO2 22 06/13/2021  ? TSH 2.650 09/03/2019  ? HGBA1C 6.8 (A) 09/11/2021  ? ? ? ?Assessment & Plan:  ?[name] was seen today for follow-up. ? ?Diagnoses and all orders for this visit: ? ?Type 2 diabetes mellitus without complication, without long-term current use of insulin (Mackenzie Ford) ?-     HgB A1c 6.8 continue to monitor carbohydrates to include rice, potatoes, tortillas, breads, sweets and sodas.  Recommending exercising 30 minutes daily or 150 minutes weekly ?-     metFORMIN (GLUCOPHAGE) 1000 MG tablet; TAKE 1 TABLET BY MOUTH TWICE DAILY WITH A MEAL. ?-     Dulaglutide (TRULICITY) 1.5 DZ/3.2DJ SOPN; Inject 1.5 mg into the skin once a week. ? ?Medication refill ?-     metFORMIN (GLUCOPHAGE) 1000 MG tablet; TAKE 1 TABLET BY MOUTH TWICE DAILY WITH A MEAL. ?-     lisinopril (ZESTRIL) 5 MG tablet; TAKE 1 TABLET (5 MG TOTAL) BY MOUTH DAILY. ?-     atorvastatin (LIPITOR) 20 MG tablet; TAKE 1 TABLET (20 MG TOTAL) BY MOUTH DAILY. ? ?Essential hypertension ?Blood pressure well controlled on for renal protection ACE inhibitor ?-     lisinopril (ZESTRIL) 5 MG tablet; TAKE 1 TABLET (5 MG TOTAL) BY MOUTH DAILY. ? ?Mixed hyperlipidemia ? Healthy lifestyle diet of fruits vegetables fish nuts whole grains and low saturated fat . Foods high in cholesterol or liver, fatty meats,cheese, butter avocados, nuts and seeds, chocolate and fried foods. ?-     atorvastatin (LIPITOR) 20 MG tablet; TAKE 1 TABLET (20 MG TOTAL) BY MOUTH DAILY. ? ?I have discontinued Jinny E. Julian-Paez's ibuprofen. I am also having her maintain her AgaMatrix Presto, glucose blood, omeprazole, metFORMIN, lisinopril, Trulicity, and atorvastatin. ? ?Meds ordered this encounter  ?Medications  ? metFORMIN (GLUCOPHAGE) 1000 MG tablet  ?  Sig: TAKE 1  TABLET BY MOUTH TWICE DAILY WITH A MEAL.  ?  Dispense:  180 tablet  ?  Refill:  1  ? lisinopril (ZESTRIL) 5 MG tablet  ?  Sig: TAKE 1 TABLET (5 MG TOTAL) BY MOUTH DAILY.  ?  Dispense:  90 tablet  ?  Refill:  3  ? Dulaglutide (TRULICITY) 1.5 ME/2.6ST SOPN  ?  Sig: Inject 1.5 mg into the skin once a week.  ?  Dispense:  2 mL  ?  Refill:  6  ?  atorvastatin (LIPITOR) 20 MG tablet  ?  Sig: TAKE 1 TABLET (20 MG TOTAL) BY MOUTH DAILY.  ?  Dispense:  90 tablet  ?  Refill:  1  ? ? ? ?Follow-up:  ? ?Return in about 3 months (around 12/11/2021) for DM/fasting. ? ?The above assessment and management plan was discussed with the patient. The patient verbalized understanding of and has agreed to the management plan. Patient is aware to call the clinic if symptoms fail to improve or worsen. Patient is aware when to return to the clinic for a follow-up visit. Patient educated on when it is appropriate to go to the emergency department.  ? ?[name], NP-C ? ?  ?

## 2021-09-18 ENCOUNTER — Other Ambulatory Visit: Payer: Self-pay

## 2021-09-21 ENCOUNTER — Other Ambulatory Visit: Payer: Self-pay

## 2021-11-25 IMAGING — DX DG WRIST COMPLETE 3+V*L*
4 series · 4 of 4 positions shown · non-contrast
Comparison: None.

CLINICAL DATA: Fall, pain

EXAM:
LEFT WRIST - COMPLETE 3+ VIEW; LEFT FOREARM - 2 VIEW

[wrist pa]
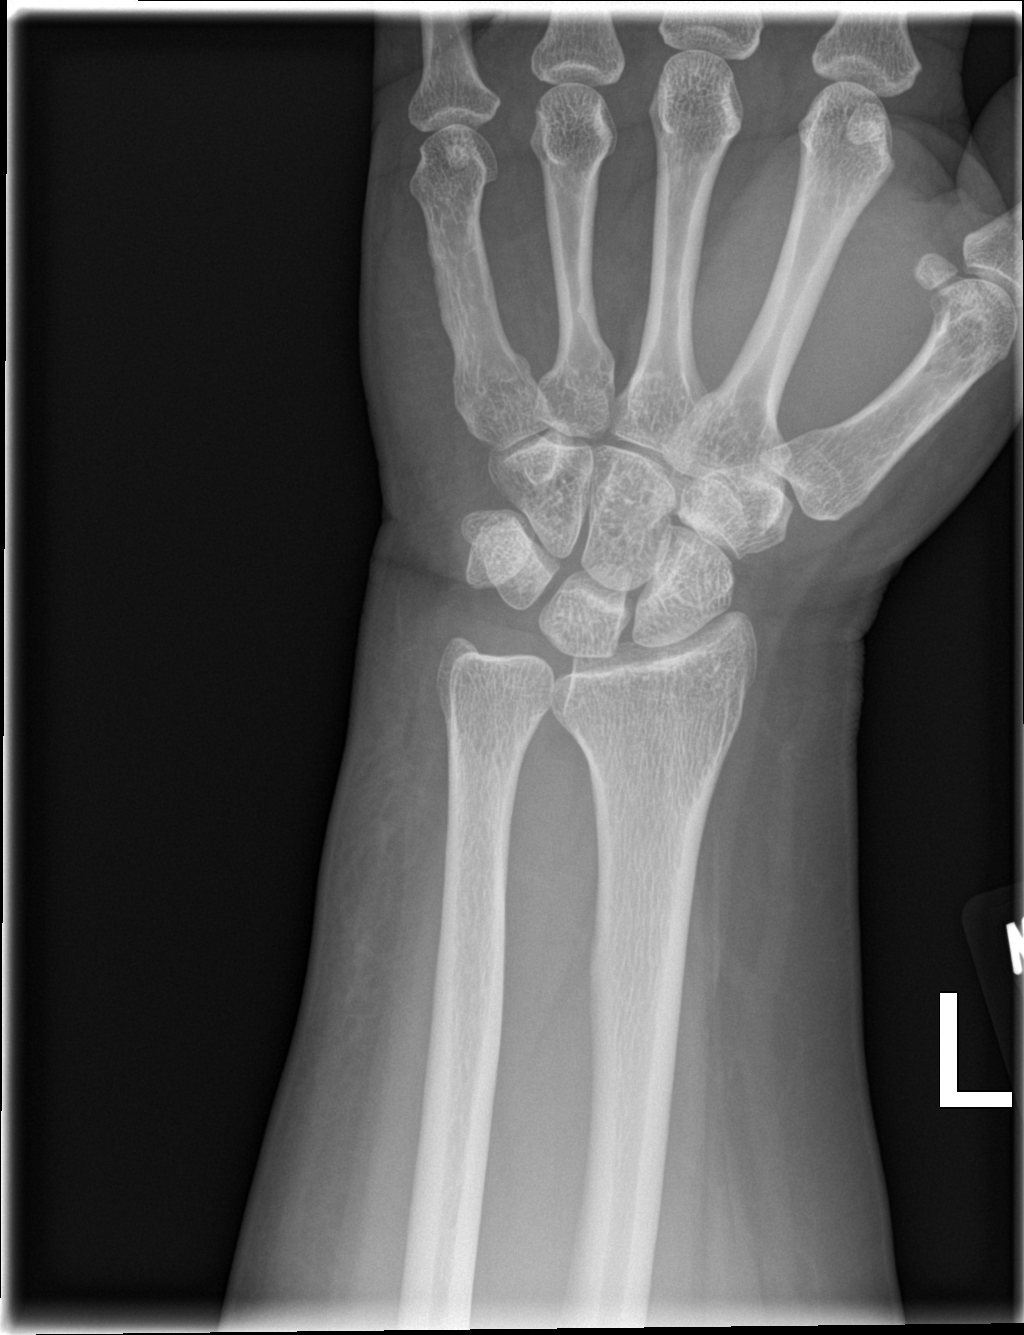

[wrist obl]
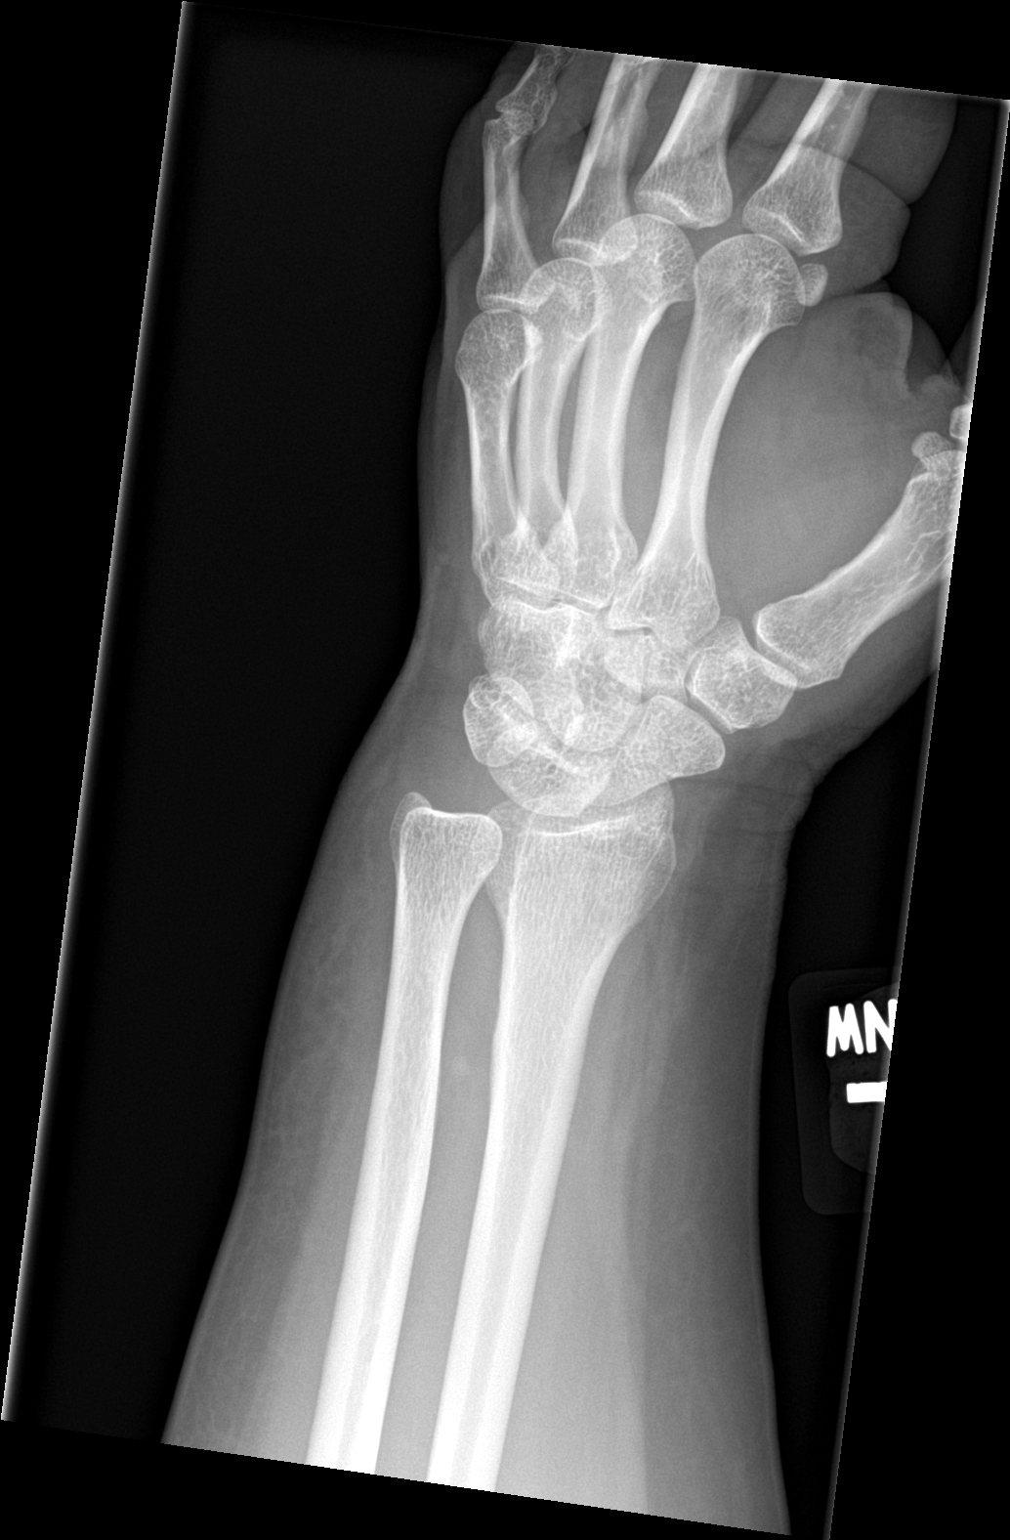

[wrist lat]
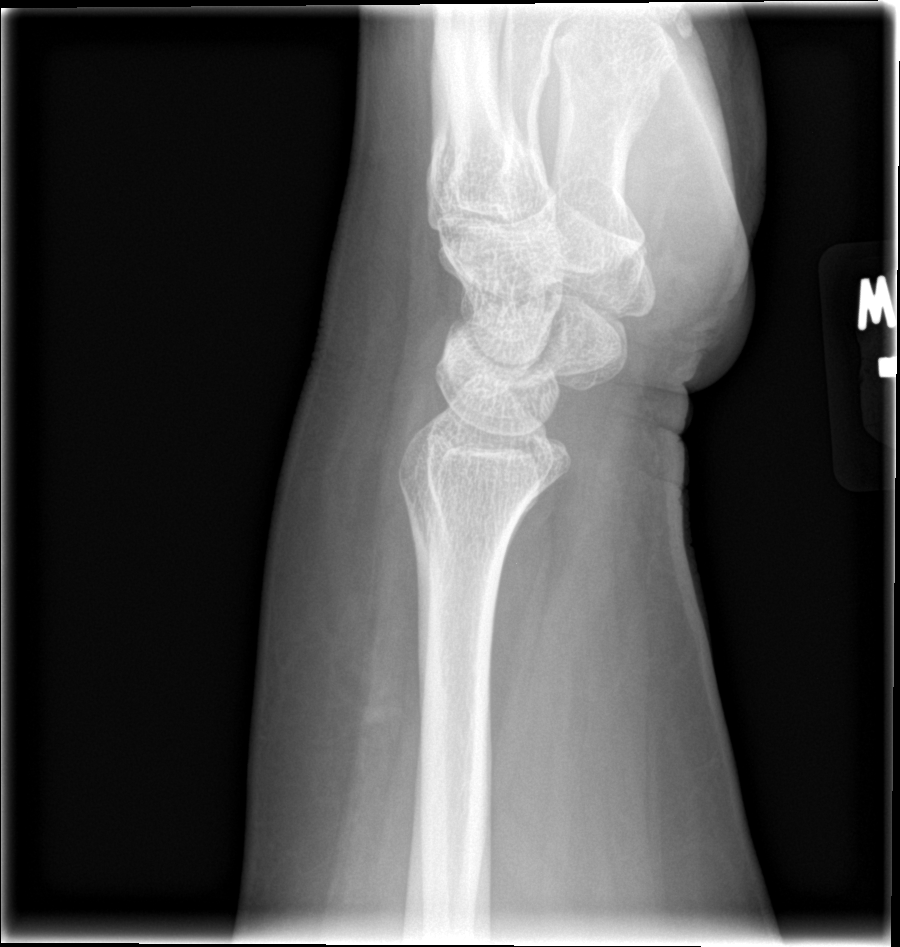

[wrist navicular]
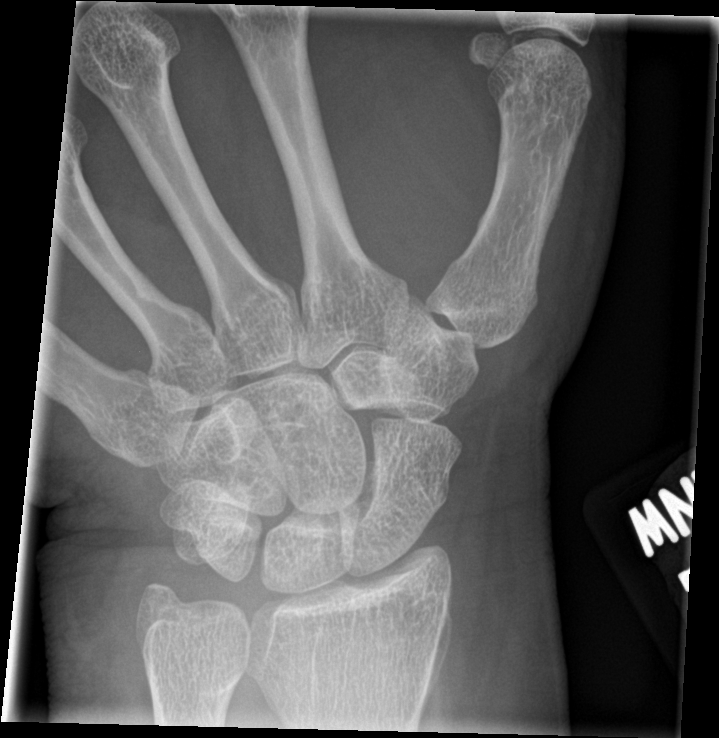

[4 of 4 positions shown; findings below may reference images not displayed]

FINDINGS: There is no evidence of fracture or dislocation. There is no
evidence of arthropathy or other focal bone abnormality. Soft tissue
edema about the medial wrist and distal forearm.
IMPRESSION: No fracture or dislocation of the left forearm or wrist. The carpus
is normally aligned. Soft tissue edema about the medial wrist and
distal forearm.

## 2021-12-05 ENCOUNTER — Other Ambulatory Visit: Payer: Self-pay

## 2021-12-13 ENCOUNTER — Ambulatory Visit (INDEPENDENT_AMBULATORY_CARE_PROVIDER_SITE_OTHER): Payer: Self-pay | Admitting: Primary Care

## 2022-01-22 ENCOUNTER — Other Ambulatory Visit: Payer: Self-pay

## 2022-04-01 ENCOUNTER — Ambulatory Visit (INDEPENDENT_AMBULATORY_CARE_PROVIDER_SITE_OTHER): Payer: Self-pay | Admitting: Primary Care

## 2022-04-01 ENCOUNTER — Encounter (INDEPENDENT_AMBULATORY_CARE_PROVIDER_SITE_OTHER): Payer: Self-pay | Admitting: Primary Care

## 2022-04-01 ENCOUNTER — Other Ambulatory Visit: Payer: Self-pay

## 2022-04-01 VITALS — BP 134/84 | HR 77 | Resp 16 | Ht 61.0 in | Wt 278.8 lb

## 2022-04-01 DIAGNOSIS — E119 Type 2 diabetes mellitus without complications: Secondary | ICD-10-CM

## 2022-04-01 DIAGNOSIS — Z1211 Encounter for screening for malignant neoplasm of colon: Secondary | ICD-10-CM

## 2022-04-01 DIAGNOSIS — Z6841 Body Mass Index (BMI) 40.0 and over, adult: Secondary | ICD-10-CM

## 2022-04-01 DIAGNOSIS — I10 Essential (primary) hypertension: Secondary | ICD-10-CM

## 2022-04-01 DIAGNOSIS — E1165 Type 2 diabetes mellitus with hyperglycemia: Secondary | ICD-10-CM

## 2022-04-01 DIAGNOSIS — Z760 Encounter for issue of repeat prescription: Secondary | ICD-10-CM

## 2022-04-01 DIAGNOSIS — E782 Mixed hyperlipidemia: Secondary | ICD-10-CM

## 2022-04-01 LAB — POCT GLYCOSYLATED HEMOGLOBIN (HGB A1C): HbA1c, POC (controlled diabetic range): 10.9 % — AB (ref 0.0–7.0)

## 2022-04-01 MED ORDER — METFORMIN HCL 1000 MG PO TABS
1000.0000 mg | ORAL_TABLET | Freq: Two times a day (BID) | ORAL | 1 refills | Status: DC
  Filled 2022-04-01 – 2022-04-08 (×2): qty 180, 90d supply, fill #0

## 2022-04-01 MED ORDER — TRULICITY 1.5 MG/0.5ML ~~LOC~~ SOAJ
1.5000 mg | SUBCUTANEOUS | 6 refills | Status: DC
  Filled 2022-04-01: qty 6, 84d supply, fill #0
  Filled 2022-04-08: qty 2, 28d supply, fill #0
  Filled 2022-06-11 – 2022-06-12 (×2): qty 2, 28d supply, fill #1

## 2022-04-01 MED ORDER — ATORVASTATIN CALCIUM 20 MG PO TABS
20.0000 mg | ORAL_TABLET | Freq: Every day | ORAL | 1 refills | Status: DC
  Filled 2022-04-01 – 2022-04-08 (×2): qty 90, 90d supply, fill #0

## 2022-04-01 MED ORDER — LISINOPRIL 5 MG PO TABS
5.0000 mg | ORAL_TABLET | Freq: Every day | ORAL | 3 refills | Status: DC
  Filled 2022-04-01 – 2022-04-08 (×2): qty 90, 90d supply, fill #0
  Filled 2022-08-08: qty 90, 90d supply, fill #1

## 2022-04-01 NOTE — Progress Notes (Signed)
Mackenzie Ford, is a 46 y.o. female  MOQ:[id number]  PTW:[id number]  DOB - Jan 27, 1976  Chief Complaint  Patient presents with   Diabetes   Hypertension       Subjective:   Mackenzie Ford is a 46 y.o. 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  Patient is here today for a follow up for the management of type 2 diabetes.  She endorses having increase urination and probably , no vision problems.  She knew that her blood sugar was going to be elevated and was expecting a good reason for meant to remind her of what uncontrolled diabetes can cause.  She admits to is soda's and not watching what she is eating.  Blood pressure is acceptable she patient has No headache, No chest pain, No abdominal pain - No Nausea, No new weakness tingling or numbness, No Cough - shortness of breath  No problems updated.  No Known Allergies  Past Medical History:  Diagnosis Date   Hyperlipidemia 2017   Microalbuminuria 02/10/2017   Morbid obesity with body mass index (BMI) of 50.0 to 59.9 in adult [facility]) 2016   Type 2 diabetes mellitus with hyperglycemia, without long-term current use of insulin ([place]) 05/21/2007    Current Outpatient Medications on File Prior to Visit  Medication Sig Dispense Refill   atorvastatin (LIPITOR) 20 MG tablet TAKE 1 TABLET (20 MG TOTAL) BY MOUTH DAILY. 90 tablet 1   Blood Glucose Monitoring Suppl (AGAMATRIX PRESTO) w/Device KIT Check sugars twice daily before meals 1 kit 0   Dulaglutide (TRULICITY) 1.5 ZG/0.1VC SOPN Inject 1.5 mg into the skin once a week. 2 mL 6   glucose blood (AGAMATRIX PRESTO TEST) test strip Check sugars twice daily before meals 100 each 12   lisinopril (ZESTRIL) 5 MG tablet TAKE 1 TABLET (5 MG TOTAL) BY MOUTH DAILY. 90 tablet 3   metFORMIN (GLUCOPHAGE) 1000 MG tablet TAKE 1 TABLET BY MOUTH TWICE DAILY WITH A MEAL. 180 tablet 1   omeprazole (PRILOSEC) 20 MG capsule Take 1 capsule (20 mg total) by mouth  daily. (Patient not taking: Reported on 03/26/2021) 30 capsule 3   No current facility-administered medications on file prior to visit.    Objective:   Vitals:   04/01/22 0838  BP: 134/84  Pulse: 77  Resp: 16  SpO2: 96%  Weight: 278 lb 12.8 oz (126.5 kg)  Height: _0  (1.549 m)    Exam General appearance : Awake, alert, not in any distress. Speech Clear. Not toxic looking HEENT: Atraumatic and Normocephalic, pupils equally reactive to light and accomodation Neck: Supple, no JVD. No cervical lymphadenopathy.  Chest: Good air entry bilaterally, no added sounds  CVS: S1 S2 regular, no murmurs.  Abdomen: Bowel sounds present, Non tender and not distended with no gaurding, rigidity or rebound. Extremities: B/L Lower Ext shows no edema, both legs are warm to touch Neurology: Awake alert, and oriented X 3, I intact, Non focal Skin: No Rash  Data Review Lab Results  Component Value Date   HGBA1C 10.9 (A) 04/01/2022   HGBA1C 6.8 (A) 09/11/2021   HGBA1C 7.1 (A) 06/13/2021    Assessment & Plan   1. Type 2 diabetes mellitus without complication, without long-term current use of insulin (HCC) Previously 6.3 and well controlled today she is 10.3. Discussed  co- morbidities with uncontrol diabetes  Complications -diabetic retinopathy, (close your eyes ? What do you see nothing) nephropathy decrease in kidney function- can lead to dialysis-on a machine  3 days a week to filter your kidney, neuropathy- numbness and tinging in your hands and feet,  increase risk of heart attack and stroke, and amputation due to decrease wound healing and circulation. Decrease your risk by taking medication daily as prescribed, monitor carbohydrates- foods that are high in carbohydrates are the following rice, potatoes, breads, sugars, and pastas.  Reduction in the intake (eating) will assist in lowering your blood sugars. Exercise daily at least 30 minutes daily.  - POCT glycosylated hemoglobin (Hb A1C) -  Microalbumin / creatinine urine ratio  2. Mixed hyperlipidemia  Healthy lifestyle diet of fruits vegetables fish nuts whole grains and low saturated fat . Foods high in cholesterol or liver, fatty meats,cheese, butter avocados, nuts and seeds, chocolate and fried foods.  3. Morbid obesity with body mass index (BMI) of 50.0 to 59.9 in adult [facility]) Morbid Obesity is >40  indicating an excess in caloric intake or underlining conditions. This may lead to other co-morbidities. Educated on lifestyle modifications of diet and exercise which may reduce obesity.  Not indicated for weight loss but side effect can be weight loss.  4. Comprehensive diabetic foot examination, type 2 DM, encounter for ([place]) Completed   5. Colon cancer screening FOBT  Patient have been counseled extensively about nutrition and exercise. Other issues discussed during this visit include: low cholesterol diet, weight control and daily exercise, foot care, annual eye examinations at Ophthalmology, importance of adherence with medications and regular follow-up. We also discussed long term complications of uncontrolled diabetes and hypertension.   Return in about 6 weeks (around 05/13/2022) for pap.  The patient was given clear instructions to go to ER or return to medical center if symptoms don't improve, worsen or new problems develop. The patient verbalized understanding. The patient was told to call to get lab results if they haven't heard anything in the next week.   This note has been created with Surveyor, quantity. Any transcriptional errors are unintentional.   Kerin Perna, NP 04/01/2022, 8:55 AM

## 2022-04-05 ENCOUNTER — Other Ambulatory Visit: Payer: Self-pay

## 2022-04-08 ENCOUNTER — Other Ambulatory Visit: Payer: Self-pay

## 2022-04-10 ENCOUNTER — Other Ambulatory Visit: Payer: Self-pay

## 2022-05-09 ENCOUNTER — Other Ambulatory Visit: Payer: Self-pay

## 2022-05-09 ENCOUNTER — Ambulatory Visit: Payer: Self-pay | Attending: Primary Care | Admitting: Pharmacist

## 2022-05-09 DIAGNOSIS — E1165 Type 2 diabetes mellitus with hyperglycemia: Secondary | ICD-10-CM

## 2022-05-09 MED ORDER — TRUEPLUS LANCETS 28G MISC
2 refills | Status: DC
  Filled 2022-05-09: qty 100, 100d supply, fill #0

## 2022-05-09 MED ORDER — TRUE METRIX BLOOD GLUCOSE TEST VI STRP
ORAL_STRIP | 3 refills | Status: DC
  Filled 2022-05-09: qty 50, 30d supply, fill #0

## 2022-05-09 MED ORDER — ONETOUCH VERIO VI STRP
ORAL_STRIP | 2 refills | Status: AC
  Filled 2022-05-09: qty 50, 30d supply, fill #0

## 2022-05-09 MED ORDER — ONETOUCH VERIO W/DEVICE KIT
PACK | 0 refills | Status: AC
  Filled 2022-05-09: qty 1, 30d supply, fill #0

## 2022-05-09 MED ORDER — TRUE METRIX METER W/DEVICE KIT
PACK | 0 refills | Status: DC
  Filled 2022-05-09: qty 1, 30d supply, fill #0

## 2022-05-09 MED ORDER — ONETOUCH DELICA LANCETS 33G MISC
2 refills | Status: AC
  Filled 2022-05-09: qty 100, 30d supply, fill #0

## 2022-05-09 NOTE — Progress Notes (Signed)
    S:     No chief complaint on file.  46 y.o. female who presents for diabetes evaluation, education, and management.  PMH is significant for T2DM with microalbuminuria, obesity, HLD.  Patient was referred and last seen by Primary Care Provider, Gwinda Passe, on 04/01/2022.   Today, patient arrives in good spirits and presents without any assistance.  Patient reports Diabetes was diagnosed 2-3 years ago. She was placed on oral medication at the time of dx. She has never been in the hospital for DM. She has no known hx of clinical ASCVD, CHF, or CKD. No thyroid cancer. No pancreatitis hx. In 2022-23 she had mostly good A1c control ranging from 6.3-7.1%. Recently, her A1c was 10.9%. Today, pt admits that this is due to dietary indiscretion (sweets and juices).   Family/Social History:  Fhx: DM Tobacco: never smoker Alcohol: none reported  Current diabetes medications include: metformin 1000 mg BID, Trulicity 1.5 mg weekly   Patient reports adherence to taking all medications as prescribed.   Insurance coverage: none  Patient denies hypoglycemic events.  Reported home fasting blood sugars: not checking  Reported 2 hour post-meal/random blood sugars: not checking.  Patient denies nocturia (nighttime urination).  Patient denies neuropathy (nerve pain). Patient reports visual changes. Patient reports self foot exams.   Patient reported dietary habits: -Since last PCP visit, pt has aggressively modified her diet  -Cut out all bread, fruit juices, and sugars since last visit with Marcelino Duster   Patient-reported exercise habits: none  O:  7 day average blood glucose:  no meter with her today.  No CGM in place.   Lab Results  Component Value Date   HGBA1C 10.9 (A) 04/01/2022   There were no vitals filed for this visit.  Lipid Panel     Component Value Date/Time   CHOL 151 06/13/2021 1036   TRIG 176 (H) 06/13/2021 1036   HDL 47 06/13/2021 1036   CHOLHDL 3.2 06/13/2021  1036   LDLCALC 74 06/13/2021 1036    Clinical Atherosclerotic Cardiovascular Disease (ASCVD): No  The 10-year ASCVD risk score (Arnett DK, et al., 2019) is: 2.4%   Values used to calculate the score:     Age: 57 years     Sex: Female     Is Non-Hispanic African American: No     Diabetic: Yes     Tobacco smoker: No     Systolic Blood Pressure: 134 mmHg     Is BP treated: Yes     HDL Cholesterol: 47 mg/dL     Total Cholesterol: 151 mg/dL   A/P: Diabetes longstanding currently uncontrolled. Patient is able to verbalize appropriate hypoglycemia management plan. Medication adherence appears appropriate. Will write her for glucometer supplies to use at home and continue current regimen. Will see her back in 1 month to assess home glycemic control. -Continued current regimen.  -Extensively discussed pathophysiology of diabetes, recommended lifestyle interventions, dietary effects on blood sugar control.  -Counseled on s/sx of and management of hypoglycemia.  -Next A1c anticipated 06/2022.   Written patient instructions provided. Patient verbalized understanding of treatment plan.  Total time in face to face counseling 30 minutes.    Follow-up:  Pharmacist in 1 month.   Butch Penny, PharmD, Patsy [place], CPP Clinical Pharmacist [facility] [phone]

## 2022-05-09 NOTE — Progress Notes (Deleted)
S:     No chief complaint on file.  46 y.o. female who presents for diabetes evaluation, education, and management.  PMH is significant for ***.  Patient was referred and last seen by Primary Care Provider, Dr. ***, on ***.  *** Patient was referred by *** on ***. Patient was last seen by Primary Care Provider, Dr. ***, on ***.  At last visit, ***.   Today, patient arrives in *** good spirits and presents without *** any assistance. ***  Patient reports Diabetes was diagnosed 2-3 years ago. She was placed on oral medication at the time. She has never been in the hospital for DM. She has no known hx of clinical ASCVD, CHF, or CKD. No thyroid cancer. No pancreatitis hx. In 2022-23 she had mostly good A1c control ranging from 6.3-7.1%. Recently, her A1c was 10.9%. Today, pt admits that this is due to dietary indiscretion (sweets and juices).   Family/Social History:  Fhx: DM Tobacco: never smoker Alcohol: none reported  Current diabetes medications include: metformin 1000 mg BID, Trulicity 1.5 mg weekly   Patient reports adherence to taking all medications as prescribed.   Insurance coverage: none  Patient denies hypoglycemic events.  Reported home fasting blood sugars: not checking  Reported 2 hour post-meal/random blood sugars: not checking.  Patient denies nocturia (nighttime urination).  Patient denies neuropathy (nerve pain). Patient reports visual changes. Patient reports self foot exams.   Patient reported dietary habits: -Since last PCP visit, pt has aggressively modified her diet  -Cut out all bread, fruit juices, and sugars since last visit with Marcelino Duster   Patient-reported exercise habits: none  O:  7 day average blood glucose:  no meter with her today.  No CGM in place.   Lab Results  Component Value Date   HGBA1C 10.9 (A) 04/01/2022   There were no vitals filed for this visit.  Lipid Panel     Component Value Date/Time   CHOL 151 06/13/2021 1036    TRIG 176 (H) 06/13/2021 1036   HDL 47 06/13/2021 1036   CHOLHDL 3.2 06/13/2021 1036   LDLCALC 74 06/13/2021 1036    Clinical Atherosclerotic Cardiovascular Disease (ASCVD): No  The 10-year ASCVD risk score (Arnett DK, et al., 2019) is: 2.4%   Values used to calculate the score:     Age: 30 years     Sex: Female     Is Non-Hispanic African American: No     Diabetic: Yes     Tobacco smoker: No     Systolic Blood Pressure: 134 mmHg     Is BP treated: Yes     HDL Cholesterol: 47 mg/dL     Total Cholesterol: 151 mg/dL   A/P: Diabetes longstanding *** currently ***. Patient is *** able to verbalize appropriate hypoglycemia management plan. Medication adherence appears ***. Control is suboptimal due to ***. -{Meds adjust:18428} basal insulin *** (insulin ***). Patient will continue to titrate 1 unit every *** days if fasting blood sugar > 100mg /dl until fasting blood sugars reach goal or next visit.  -{Meds adjust:18428} rapid insulin *** (insulin ***) to ***.  -{Meds adjust:18428} GLP-1 *** (generic ***) to ***.  -{Meds adjust:18428} SGLT2-I *** (generic ***) to ***. Counseled on sick day rules. -{Meds adjust:18428} metformin *** to ***.  -Patient educated on purpose, proper use, and potential adverse effects of ***.  -Extensively discussed pathophysiology of diabetes, recommended lifestyle interventions, dietary effects on blood sugar control.  -Counseled on s/sx of and management of hypoglycemia.  -  Next A1c anticipated ***.   ASCVD risk - primary ***secondary prevention in patient with diabetes. Last LDL is *** not at goal of <28 *** mg/dL. ASCVD risk factors include *** and 10-year ASCVD risk score of ***. {Desc; low/moderate/high:110033} intensity statin indicated.  -{Meds adjust:18428} ***statin *** mg.   Hypertension longstanding *** currently ***. Blood pressure goal of <130/80 *** mmHg. Medication adherence ***. Blood pressure control is suboptimal due to ***. -***  Written  patient instructions provided. Patient verbalized understanding of treatment plan.  Total time in face to face counseling *** minutes.    Follow-up:  Pharmacist ***. PCP clinic visit in ***.  Patient seen with ***.

## 2022-05-15 ENCOUNTER — Other Ambulatory Visit: Payer: Self-pay

## 2022-05-15 ENCOUNTER — Encounter (INDEPENDENT_AMBULATORY_CARE_PROVIDER_SITE_OTHER): Payer: Self-pay | Admitting: Primary Care

## 2022-05-15 ENCOUNTER — Ambulatory Visit (INDEPENDENT_AMBULATORY_CARE_PROVIDER_SITE_OTHER): Payer: Self-pay | Admitting: Primary Care

## 2022-05-15 VITALS — BP 116/77 | HR 68 | Temp 98.2°F | Resp 16 | Wt 274.0 lb

## 2022-05-15 DIAGNOSIS — E119 Type 2 diabetes mellitus without complications: Secondary | ICD-10-CM

## 2022-05-15 DIAGNOSIS — E782 Mixed hyperlipidemia: Secondary | ICD-10-CM

## 2022-05-15 DIAGNOSIS — R748 Abnormal levels of other serum enzymes: Secondary | ICD-10-CM

## 2022-05-15 LAB — GLUCOSE, POCT (MANUAL RESULT ENTRY): POC Glucose: 128 mg/dl — AB (ref 70–99)

## 2022-05-15 MED ORDER — GLIPIZIDE 10 MG PO TABS
10.0000 mg | ORAL_TABLET | Freq: Two times a day (BID) | ORAL | 1 refills | Status: DC
  Filled 2022-05-15: qty 60, 30d supply, fill #0
  Filled 2022-05-23: qty 180, 90d supply, fill #0

## 2022-05-15 NOTE — Progress Notes (Unsigned)
Subjective:  Patient ID: Mackenzie Ford, female    DOB: 1976/02/08  Age: 46 y.o. MRN: [id number]  CC: Diabetes  ( Interpreter Erin Hearing [id number])  HPI Mackenzie Ford presents for Follow-up of diabetes. Patient does not check blood sugar at home  Compliant with meds - Yes Checking CBGs? No  Fasting avg -   Postprandial average -  Exercising regularly? - Yes Watching carbohydrate intake? - Yes Neuropathy ? - No Hypoglycemic events - No  - Recovers with :   Pertinent ROS:  Polyuria - No Polydipsia - No Vision problems - No  Medications as noted below. Taking them regularly without complication/adverse reaction being reported today.   History Mackenzie Ford has a past medical history of Hyperlipidemia (2017), Microalbuminuria (02/10/2017), Morbid obesity with body mass index (BMI) of 50.0 to 59.9 in adult [facility]) (2016), and Type 2 diabetes mellitus with hyperglycemia, without long-term current use of insulin ([place]) (05/21/2007).   She has a past surgical history that includes Cesarean section; Cesarean section; Cesarean section; and Tubal ligation (06/28/2007).   Her family history includes Asthma in her son; Cancer (age of onset: 92) in her mother; Diabetes in her mother; HIV/AIDS in her brother.She reports that she has never smoked. She has never used smokeless tobacco. She reports that she does not drink alcohol and does not use drugs.  Current Outpatient Medications on File Prior to Visit  Medication Sig Dispense Refill   atorvastatin (LIPITOR) 20 MG tablet Take 1 tablet (20 mg total) by mouth daily. 90 tablet 1   Blood Glucose Monitoring Suppl (ONETOUCH VERIO) w/Device KIT Use to check blood sugar once daily. 1 kit 0   Dulaglutide (TRULICITY) 1.5 YB/3.3OV SOPN Inject 1.5 mg into the skin once a week. 2 mL 6   lisinopril (ZESTRIL) 5 MG tablet Take 1 tablet (5 mg total) by mouth daily. 90 tablet 3   metFORMIN (GLUCOPHAGE) 1000 MG tablet Take 1 tablet (1,000 mg total) by mouth 2  (two) times daily with a meal. 180 tablet 1   OneTouch Delica Lancets 29V MISC Use to check blood sugar once daily. 100 each 2   glucose blood (ONETOUCH VERIO) test strip Use to check blood sugar once daily. 100 each 2   No current facility-administered medications on file prior to visit.    ROS Comprehensive ROS Pertinent positive and negative noted in HPI    Objective:  Blood Pressure 116/77   Pulse 68   Temperature 98.2 F (36.8 C) (Oral)   Respiration 16   Weight 274 lb (124.3 kg)   Oxygen Saturation 95%   Body Mass Index 51.77 kg/m   BP Readings from Last 3 Encounters:  05/15/22 116/77  04/01/22 134/84  09/11/21 118/76    Wt Readings from Last 3 Encounters:  05/15/22 274 lb (124.3 kg)  04/01/22 278 lb 12.8 oz (126.5 kg)  09/11/21 281 lb 9.6 oz (127.7 kg)    Physical Exam Vitals reviewed.  Constitutional:      Appearance: She is obese.     Comments: Severe morbid  HENT:     Head: Normocephalic.     Right Ear: Tympanic membrane and external ear normal.     Left Ear: Tympanic membrane and external ear normal.     Nose: Nose normal.  Eyes:     Extraocular Movements: Extraocular movements intact.     Pupils: Pupils are equal, round, and reactive to light.  Cardiovascular:     Rate and Rhythm: Normal rate and regular rhythm.  Pulmonary:     Effort: Pulmonary effort is normal.     Breath sounds: Normal breath sounds.  Abdominal:     General: Bowel sounds are normal. There is distension.     Palpations: Abdomen is soft.  Musculoskeletal:        General: Normal range of motion.     Cervical back: Normal range of motion and neck supple.  Skin:    General: Skin is warm and dry.  Neurological:     Mental Status: She is alert and oriented to person, place, and time.  Psychiatric:        Mood and Affect: Mood normal.        Behavior: Behavior normal.     Lab Results  Component Value Date   HGBA1C 10.9 (A) 04/01/2022   HGBA1C 6.8 (A) 09/11/2021   HGBA1C  7.1 (A) 06/13/2021    Lab Results  Component Value Date   WBC 7.4 05/15/2022   HGB 13.8 05/15/2022   HCT 43.3 05/15/2022   PLT 378 05/15/2022   GLUCOSE 124 (H) 05/15/2022   CHOL 154 05/15/2022   TRIG 122 05/15/2022   HDL 44 05/15/2022   LDLCALC 88 05/15/2022   ALT 102 (H) 05/15/2022   AST 73 (H) 05/15/2022   NA 142 05/15/2022   K 4.2 05/15/2022   CL 106 05/15/2022   CREATININE 0.53 (L) 05/15/2022   BUN 9 05/15/2022   CO2 19 (L) 05/15/2022   TSH 2.650 09/03/2019   HGBA1C 10.9 (A) 04/01/2022     Assessment & Plan:  Mackenzie Ford was seen today for diabetes.  Diagnoses and all orders for this visit:  Type 2 diabetes mellitus without complication, without long-term current use of insulin ([place]) - educated on lifestyle modifications, including but not limited to diet choices and adding exercise to daily routine.   -     Glucose (CBG) -     glipiZIDE (GLUCOTROL) 10 MG tablet; Take 1 tablet (10 mg total) by mouth 2 (two) times daily before a meal. -     CBC with Differential/Platelet  Elevated liver enzymes -     CMP14+EGFR  Morbid obesity (HCC) Morbid Obesity is > 40 BMI  indicating an excess in caloric intake or underlining conditions. This may lead to other co-morbidities. Educated on lifestyle modifications of diet and exercise which may reduce obesity.    Mixed hyperlipidemia  Healthy lifestyle diet of fruits vegetables fish nuts whole grains and low saturated fat . Foods high in cholesterol or liver, fatty meats,cheese, butter avocados, nuts and seeds, chocolate and fried foods. -     Lipid panel    I am having [name] start on glipiZIDE. I am also having her maintain her atorvastatin, Trulicity, metFORMIN, lisinopril, OneTouch Verio, OneTouch Verio, and OneTouch Delica Lancets 42P.  Meds ordered this encounter  Medications   glipiZIDE (GLUCOTROL) 10 MG tablet    Sig: Take 1 tablet (10 mg total) by mouth 2 (two) times daily before a meal.    Dispense:  180  tablet    Refill:  1    Order Specific Question:   Supervising Provider    Answer:   Tresa Garter [id number]     Follow-up:   Return in about 3 months (around 08/14/2022) for DM.  The above assessment and management plan was discussed with the patient. The patient verbalized understanding of and has agreed to the management plan. Patient is aware to call the clinic if symptoms fail to improve  or worsen. Patient is aware when to return to the clinic for a follow-up visit. Patient educated on when it is appropriate to go to the emergency department.   [name], NP-C

## 2022-05-15 NOTE — Progress Notes (Unsigned)
F/u DM 

## 2022-05-16 LAB — CBC WITH DIFFERENTIAL/PLATELET
Basophils Absolute: 0 10*3/uL (ref 0.0–0.2)
Basos: 1 %
EOS (ABSOLUTE): 0.2 10*3/uL (ref 0.0–0.4)
Eos: 2 %
Hematocrit: 43.3 % (ref 34.0–46.6)
Hemoglobin: 13.8 g/dL (ref 11.1–15.9)
Immature Grans (Abs): 0 10*3/uL (ref 0.0–0.1)
Immature Granulocytes: 0 %
Lymphocytes Absolute: 2.7 10*3/uL (ref 0.7–3.1)
Lymphs: 37 %
MCH: 28.2 pg (ref 26.6–33.0)
MCHC: 31.9 g/dL (ref 31.5–35.7)
MCV: 89 fL (ref 79–97)
Monocytes Absolute: 0.5 10*3/uL (ref 0.1–0.9)
Monocytes: 7 %
Neutrophils Absolute: 4 10*3/uL (ref 1.4–7.0)
Neutrophils: 53 %
Platelets: 378 10*3/uL (ref 150–450)
RBC: 4.89 x10E6/uL (ref 3.77–5.28)
RDW: 12.2 % (ref 11.7–15.4)
WBC: 7.4 10*3/uL (ref 3.4–10.8)

## 2022-05-16 LAB — CMP14+EGFR
ALT: 102 IU/L — ABNORMAL HIGH (ref 0–32)
AST: 73 IU/L — ABNORMAL HIGH (ref 0–40)
Albumin/Globulin Ratio: 1.2 (ref 1.2–2.2)
Albumin: 3.7 g/dL — ABNORMAL LOW (ref 3.9–4.9)
Alkaline Phosphatase: 127 IU/L — ABNORMAL HIGH (ref 44–121)
BUN/Creatinine Ratio: 17 (ref 9–23)
BUN: 9 mg/dL (ref 6–24)
Bilirubin Total: 0.3 mg/dL (ref 0.0–1.2)
CO2: 19 mmol/L — ABNORMAL LOW (ref 20–29)
Calcium: 8.8 mg/dL (ref 8.7–10.2)
Chloride: 106 mmol/L (ref 96–106)
Creatinine, Ser: 0.53 mg/dL — ABNORMAL LOW (ref 0.57–1.00)
Globulin, Total: 3.2 g/dL (ref 1.5–4.5)
Glucose: 124 mg/dL — ABNORMAL HIGH (ref 70–99)
Potassium: 4.2 mmol/L (ref 3.5–5.2)
Sodium: 142 mmol/L (ref 134–144)
Total Protein: 6.9 g/dL (ref 6.0–8.5)
eGFR: 114 mL/min/{1.73_m2} (ref >59)

## 2022-05-16 LAB — LIPID PANEL
Chol/HDL Ratio: 3.5 ratio (ref 0.0–4.4)
Cholesterol, Total: 154 mg/dL (ref 100–199)
HDL: 44 mg/dL (ref >39)
LDL Chol Calc (NIH): 88 mg/dL (ref 0–99)
Triglycerides: 122 mg/dL (ref 0–149)
VLDL Cholesterol Cal: 22 mg/dL (ref 5–40)

## 2022-05-22 ENCOUNTER — Other Ambulatory Visit: Payer: Self-pay

## 2022-05-23 ENCOUNTER — Other Ambulatory Visit: Payer: Self-pay

## 2022-05-28 ENCOUNTER — Other Ambulatory Visit: Payer: Self-pay

## 2022-05-28 MED ORDER — AMOXICILLIN 500 MG PO CAPS
500.0000 mg | ORAL_CAPSULE | Freq: Three times a day (TID) | ORAL | 0 refills | Status: DC
  Filled 2022-05-28: qty 30, 10d supply, fill #0

## 2022-05-29 ENCOUNTER — Other Ambulatory Visit: Payer: Self-pay

## 2022-06-11 ENCOUNTER — Other Ambulatory Visit: Payer: Self-pay

## 2022-06-12 ENCOUNTER — Other Ambulatory Visit: Payer: Self-pay

## 2022-06-13 ENCOUNTER — Other Ambulatory Visit: Payer: Self-pay

## 2022-06-13 ENCOUNTER — Other Ambulatory Visit (INDEPENDENT_AMBULATORY_CARE_PROVIDER_SITE_OTHER): Payer: Self-pay | Admitting: Primary Care

## 2022-06-13 DIAGNOSIS — E119 Type 2 diabetes mellitus without complications: Secondary | ICD-10-CM

## 2022-06-13 MED ORDER — TRULICITY 1.5 MG/0.5ML ~~LOC~~ SOAJ
1.5000 mg | SUBCUTANEOUS | 5 refills | Status: DC
  Filled 2022-06-13: qty 2, 28d supply, fill #0

## 2022-06-13 NOTE — Telephone Encounter (Signed)
Future visit in 2 months. Requested Prescriptions  Pending Prescriptions Disp Refills   Dulaglutide (TRULICITY) 1.5 JG/2.8ZM SOPN 2 mL 5    Sig: Inject 1.5 mg into the skin once a week.     Endocrinology:  Diabetes - GLP-1 Receptor Agonists Failed - 06/13/2022 12:15 PM      Failed - HBA1C is between 0 and 7.9 and within 180 days    HbA1c, POC (controlled diabetic range)  Date Value Ref Range Status  04/01/2022 10.9 (A) 0.0 - 7.0 % Final         Passed - Valid encounter within last 6 months    Recent Outpatient Visits           4 weeks ago Type 2 diabetes mellitus without complication, without long-term current use of insulin ([place])   [place] Kerin Perna, NP   1 month ago Type 2 diabetes mellitus with hyperglycemia, without long-term current use of insulin [facility])   [place], [place] L, RPH-CPP   2 months ago Type 2 diabetes mellitus without complication, without long-term current use of insulin ([place])   [place], Michelle P, NP   9 months ago Type 2 diabetes mellitus without complication, without long-term current use of insulin ([place])   [place], Michelle P, NP   1 year ago Type 2 diabetes mellitus without complication, without long-term current use of insulin [facility])   [place], Lee, NP       Future Appointments             In 5 days Daisy Blossom, Jarome Matin, [place]   In 2 months Oletta Lamas, Milford Cage, NP [place]

## 2022-06-13 NOTE — Telephone Encounter (Signed)
Medication Refill - Medication: Dulaglutide (TRULICITY) 1.5 SH/6.8HF SOPN   Has the patient contacted their pharmacy? Yes.   (Agent: If no, request that the patient contact the pharmacy for the refill. If patient does not wish to contact the pharmacy document the reason why and proceed with request.) (Agent: If yes, when and what did the pharmacy advise?)  Preferred Pharmacy (with phone number or street name):  [facility] [address] [place] [address]  Phone: [phone] Fax: [phone]   Has the patient been seen for an appointment in the last year OR does the patient have an upcoming appointment? Yes.    Agent: Please be advised that RX refills may take up to 3 business days. We ask that you follow-up with your pharmacy.

## 2022-06-18 ENCOUNTER — Ambulatory Visit: Payer: Self-pay | Attending: Family Medicine | Admitting: Pharmacist

## 2022-06-18 DIAGNOSIS — E1165 Type 2 diabetes mellitus with hyperglycemia: Secondary | ICD-10-CM

## 2022-06-18 NOTE — Progress Notes (Signed)
S:     No chief complaint on file.  47 y.o. female who presents for diabetes evaluation, education, and management.  PMH is significant for T2DM with microalbuminuria, hyperlipidemia.  Patient was referred and last seen by Primary Care Provider, [name] on 05/15/2022.   Today, patient arrives in good spirits and presents without any assistance.   Patient reports Diabetes was diagnosed 2-3 years ago. She was placed on oral medication at the time of dx. She has never been hospitalized for DM. She has no known history of ASCVD, CHF, or CKD. No hx of thyroid cancer or pancreatitis. She had mostly good A1c control from 2022-23 ranging from 6.3-7.1%. Last A1c was 10.9% taken on 04/01/22. She admitted she had some dietary indiscretion (sweets and juices). Today, patient states she has made major changes to her diet and avoids added sugars.  Family Hx: DM Social Hx: No hx of alcohol or tobacco use.  Current diabetes medications include: metformin 0254 mg BID, Trulicity 1.5 mg weekly, glipizide 10 mg BID. Current hyperlipidemia medications include: atorvastatin 20 mg daily.  Patient reports adherence to taking metformin and Trulicity. Patient states she did not start taking glipizide 10 mg BID.  Do you feel that your medications are working for you? yes Have you been experiencing any side effects to the medications prescribed? no Do you have any problems obtaining medications due to transportation or finances? no Insurance coverage: None  Patient denies hypoglycemic events.  Patient's meter shows numbers between 134-152. Patient states she has not been checking it very often due to pain from a tooth infection.  Patient denies nocturia (nighttime urination).  Patient denies neuropathy (nerve pain). Patient denies visual changes. Patient reports self foot exams.   Patient is making significant efforts to improve her diet and endorses restricting sugars.  Patient-reported  exercise habits: Patient states she has been trying to walk more frequently.  O:   Lab Results  Component Value Date   HGBA1C 10.9 (A) 04/01/2022   There were no vitals filed for this visit.  Lipid Panel     Component Value Date/Time   CHOL 154 05/15/2022 0925   TRIG 122 05/15/2022 0925   HDL 44 05/15/2022 0925   CHOLHDL 3.5 05/15/2022 0925   LDLCALC 88 05/15/2022 0925    Clinical Atherosclerotic Cardiovascular Disease (ASCVD): No  The 10-year ASCVD risk score (Arnett DK, et al., 2019) is: 2.1%   Values used to calculate the score:     Age: 37 years     Sex: Female     Is Non-Hispanic African American: No     Diabetic: Yes     Tobacco smoker: No     Systolic Blood Pressure: 270 mmHg     Is BP treated: Yes     HDL Cholesterol: 44 mg/dL     Total Cholesterol: 154 mg/dL   A/P: Diabetes longstanding currently uncontrolled due to dietary noncompliance, however, home sugars are improving. Patient is able to verbalize appropriate hypoglycemia management plan. Medication adherence appears appropriate. Control is suboptimal due to diet. Patient states she also has a tooth infection right now which may contribute to higher blood sugars. -Continued GLP-1 Trulicity 1.5 mg (generic dulaglutide) weekly.  -Continued metformin 1000 mg BID.  -Extensively discussed pathophysiology of diabetes, recommended lifestyle interventions, dietary effects on blood sugar control.  -Counseled on s/sx of and management of hypoglycemia.  -Next A1c anticipated 02/24.   ASCVD risk - primary prevention in patient with diabetes. Last LDL is  88 not at goal of <70 mg/dL. ASCVD risk factors include T2DM and 10-year ASCVD risk score of 2.1%. moderate intensity statin indicated.  -Continued atorvastatin 20 mg daily.  - Patient is not at goal LDL of <70 mg/dL, may consider increasing to high intensity statin at follow-up.  Written patient instructions provided. Patient verbalized understanding of treatment plan.   Total time in face to face counseling 30 minutes.    Follow-up:  Pharmacist 07/21/22. PCP clinic visit on 08/15/22.   Patient seen with:  Lillard Anes PharmD Candidate class of 2026 [employer]   Benard Halsted, PharmD, [place], [place] [phone]

## 2022-07-04 ENCOUNTER — Other Ambulatory Visit: Payer: Self-pay

## 2022-07-04 MED ORDER — AMOXICILLIN 500 MG PO CAPS
500.0000 mg | ORAL_CAPSULE | Freq: Three times a day (TID) | ORAL | 0 refills | Status: DC
  Filled 2022-07-04: qty 30, 10d supply, fill #0

## 2022-07-16 ENCOUNTER — Other Ambulatory Visit: Payer: Self-pay

## 2022-07-16 MED ORDER — TRAMADOL HCL 50 MG PO TABS
50.0000 mg | ORAL_TABLET | ORAL | 0 refills | Status: DC
  Filled 2022-07-16: qty 5, 1d supply, fill #0

## 2022-07-16 MED ORDER — LIDOCAINE VISCOUS HCL 2 % MT SOLN
5.0000 mL | OROMUCOSAL | 1 refills | Status: AC
  Filled 2022-07-16: qty 100, 5d supply, fill #0

## 2022-07-22 ENCOUNTER — Ambulatory Visit: Payer: Self-pay | Attending: Pharmacist | Admitting: Pharmacist

## 2022-08-08 ENCOUNTER — Other Ambulatory Visit: Payer: Self-pay

## 2022-08-13 ENCOUNTER — Other Ambulatory Visit: Payer: Self-pay

## 2022-08-13 MED ORDER — DEXAMETHASONE 4 MG PO TABS
4.0000 mg | ORAL_TABLET | Freq: Three times a day (TID) | ORAL | 0 refills | Status: DC | PRN
  Filled 2022-08-13: qty 9, 3d supply, fill #0

## 2022-08-13 MED ORDER — CHLORHEXIDINE GLUCONATE 0.12 % MT SOLN
Freq: Two times a day (BID) | OROMUCOSAL | 0 refills | Status: DC
  Filled 2022-08-13: qty 473, 16d supply, fill #0

## 2022-08-13 MED ORDER — AMOXICILLIN 500 MG PO CAPS
500.0000 mg | ORAL_CAPSULE | Freq: Three times a day (TID) | ORAL | 0 refills | Status: DC
  Filled 2022-08-13: qty 21, 7d supply, fill #0

## 2022-08-13 MED ORDER — IBUPROFEN 400 MG PO TABS
400.0000 mg | ORAL_TABLET | ORAL | 0 refills | Status: AC | PRN
  Filled 2022-08-13: qty 30, 5d supply, fill #0

## 2022-08-15 ENCOUNTER — Ambulatory Visit (INDEPENDENT_AMBULATORY_CARE_PROVIDER_SITE_OTHER): Payer: Self-pay | Admitting: Primary Care

## 2022-08-15 ENCOUNTER — Other Ambulatory Visit: Payer: Self-pay

## 2022-08-27 ENCOUNTER — Encounter: Payer: Self-pay | Admitting: Pharmacist

## 2022-08-27 ENCOUNTER — Other Ambulatory Visit: Payer: Self-pay

## 2022-08-27 ENCOUNTER — Ambulatory Visit: Payer: Self-pay | Attending: Primary Care | Admitting: Pharmacist

## 2022-08-27 DIAGNOSIS — E782 Mixed hyperlipidemia: Secondary | ICD-10-CM

## 2022-08-27 DIAGNOSIS — Z760 Encounter for issue of repeat prescription: Secondary | ICD-10-CM

## 2022-08-27 DIAGNOSIS — E119 Type 2 diabetes mellitus without complications: Secondary | ICD-10-CM

## 2022-08-27 DIAGNOSIS — E1165 Type 2 diabetes mellitus with hyperglycemia: Secondary | ICD-10-CM

## 2022-08-27 MED ORDER — METFORMIN HCL 1000 MG PO TABS
1000.0000 mg | ORAL_TABLET | Freq: Two times a day (BID) | ORAL | 1 refills | Status: DC
  Filled 2022-08-27 – 2022-10-22 (×3): qty 180, 90d supply, fill #0

## 2022-08-27 MED ORDER — TRULICITY 0.75 MG/0.5ML ~~LOC~~ SOAJ
0.7500 mg | SUBCUTANEOUS | 1 refills | Status: DC
  Filled 2022-08-27: qty 2, 28d supply, fill #0

## 2022-08-27 MED ORDER — ATORVASTATIN CALCIUM 20 MG PO TABS
20.0000 mg | ORAL_TABLET | Freq: Every day | ORAL | 1 refills | Status: DC
  Filled 2022-08-27 – 2022-10-22 (×3): qty 90, 90d supply, fill #0

## 2022-08-27 NOTE — Progress Notes (Signed)
    S:     No chief complaint on file.  47 y.o. female who presents for diabetes evaluation, education, and management.  PMH is significant for T2DM with microalbuminuria, hyperlipidemia.  Patient was referred and last seen by Primary Care Provider, Gwinda Passe on 05/15/2022. Pharmacy saw her on 06/18/2022 and she was doing well. We made no changes at that time.   Today, patient arrives in good spirits and presents without any assistance. Doing well since her last visit with [place], however, she plans to establish care with Dr. Laural Benes here next month. She ran out of Trulicity and has been without for ~1 month. She does endorse adherence to her other medications. She has no complaints today.   Family Hx: DM Social Hx: No hx of alcohol or tobacco use.  Current diabetes medications include: metformin 1000 mg BID, Trulicity 1.5 mg weekly Current hyperlipidemia medications include: atorvastatin 20 mg daily.  Patient reports adherence to taking metformin and Trulicity, however, she ran out of Trulicity ~1 month ago and never refilled it.   Insurance coverage: None  Patient denies hypoglycemic events.  Patient's meter shows numbers between 130s-160s, however, she does not check frequently. I count only a handful of CBG readings since last visit with me.  Patient denies nocturia (nighttime urination).  Patient denies neuropathy (nerve pain). Patient denies visual changes. Patient reports self foot exams.   Patient is making significant efforts to improve her diet and endorses restricting sugars.  Patient-reported exercise habits: Patient states she has been trying to walk more frequently.  O:   Lab Results  Component Value Date   HGBA1C 10.9 (A) 04/01/2022   There were no vitals filed for this visit.  Lipid Panel     Component Value Date/Time   CHOL 154 05/15/2022 0925   TRIG 122 05/15/2022 0925   HDL 44 05/15/2022 0925   CHOLHDL 3.5 05/15/2022 0925   LDLCALC 88 05/15/2022  0925    Clinical Atherosclerotic Cardiovascular Disease (ASCVD): No  The 10-year ASCVD risk score (Arnett DK, et al., 2019) is: 2.1%   Values used to calculate the score:     Age: 47 years     Sex: Female     Is Non-Hispanic African American: No     Diabetic: Yes     Tobacco smoker: No     Systolic Blood Pressure: 116 mmHg     Is BP treated: Yes     HDL Cholesterol: 44 mg/dL     Total Cholesterol: 154 mg/dL   A/P: Diabetes longstanding currently uncontrolled due to dietary noncompliance, however, home sugars are improving. Patient is able to verbalize appropriate hypoglycemia management plan. Medication adherence appears appropriate. We will have her restart Trulicity and will get an updated A1c today.  -Restart Trulicity 0.75 mg (generic dulaglutide) weekly.  -Continued metformin 1000 mg BID.  -Extensively discussed pathophysiology of diabetes, recommended lifestyle interventions, dietary effects on blood sugar control.  -Counseled on s/sx of and management of hypoglycemia.  -A1c  Written patient instructions provided. Patient verbalized understanding of treatment plan.  Total time in face to face counseling 30 minutes.    Follow-up:  Pharmacist prn. PCP clinic visit next month.  Butch Penny, PharmD, [place], CPP Clinical Pharmacist [facility] [place] [facility] [phone]

## 2022-08-28 LAB — HEMOGLOBIN A1C
Est. average glucose Bld gHb Est-mCnc: 160 mg/dL
Hgb A1c MFr Bld: 7.2 % — ABNORMAL HIGH (ref 4.8–5.6)

## 2022-09-06 ENCOUNTER — Other Ambulatory Visit: Payer: Self-pay

## 2022-10-01 ENCOUNTER — Other Ambulatory Visit: Payer: Self-pay

## 2022-10-01 ENCOUNTER — Encounter: Payer: Self-pay | Admitting: Internal Medicine

## 2022-10-01 ENCOUNTER — Ambulatory Visit: Payer: Self-pay | Attending: Internal Medicine | Admitting: Internal Medicine

## 2022-10-01 VITALS — BP 109/70 | HR 71 | Temp 98.6°F | Ht 61.0 in | Wt 279.0 lb

## 2022-10-01 DIAGNOSIS — R7989 Other specified abnormal findings of blood chemistry: Secondary | ICD-10-CM

## 2022-10-01 DIAGNOSIS — Z7689 Persons encountering health services in other specified circumstances: Secondary | ICD-10-CM

## 2022-10-01 DIAGNOSIS — Z1231 Encounter for screening mammogram for malignant neoplasm of breast: Secondary | ICD-10-CM

## 2022-10-01 DIAGNOSIS — E1169 Type 2 diabetes mellitus with other specified complication: Secondary | ICD-10-CM

## 2022-10-01 DIAGNOSIS — Z1211 Encounter for screening for malignant neoplasm of colon: Secondary | ICD-10-CM

## 2022-10-01 DIAGNOSIS — Z7984 Long term (current) use of oral hypoglycemic drugs: Secondary | ICD-10-CM

## 2022-10-01 DIAGNOSIS — Z7985 Long-term (current) use of injectable non-insulin antidiabetic drugs: Secondary | ICD-10-CM

## 2022-10-01 DIAGNOSIS — E785 Hyperlipidemia, unspecified: Secondary | ICD-10-CM

## 2022-10-01 DIAGNOSIS — Z6841 Body Mass Index (BMI) 40.0 and over, adult: Secondary | ICD-10-CM

## 2022-10-01 MED ORDER — TRULICITY 1.5 MG/0.5ML ~~LOC~~ SOAJ
1.5000 mg | SUBCUTANEOUS | 6 refills | Status: DC
Start: 2022-10-01 — End: 2023-05-12
  Filled 2022-10-01 – 2022-10-22 (×3): qty 2, 28d supply, fill #0

## 2022-10-01 NOTE — Progress Notes (Signed)
Patient ID: Mackenzie Ford, female    DOB: March 23, 1976  MRN: [id number]  CC: Establish Care (Est care / new pt./Pt requesting medication clarification, unsure what medications are for. /Yes to pap for another appt.)   Subjective: Mackenzie Ford is a 47 y.o. female who presents to est care.    Doran Stabler from [facility] is with her and interprets. Her concerns today include:  Patient with history of DM with microalbumin, HL, morbid obesity  Previous PCP was NP Gwinda Passe.  Decided to change because her husband is my pt.    DM/obesity:   Lab Results  Component Value Date   HGBA1C 7.2 (H) 08/27/2022  Reports compliance with Metformin 1 gram BID and Trulicity 0.75 mg once a wk.  Tolerating meds.  Has not lost any wgh and reports no decrease in appetite  on current dose of Trulicity.   -feels she is doing better with eating habits.  Less bread and junk foods, cut out sodas.   Checks BS once a day in a.m before BF.  Range 124-130  HL:  on Lipitor 20 mg daily.  No muscle cramps.  AST/ALT 73/102, Alk Phos 127 on labs done 04/2022.  Does not drink ETOH beverages.  Screen for hep C in past negative.  HM:  due for colon CA screen, MMG,  PAP and eye exam.   Patient Active Problem List   Diagnosis Date Noted   Stress at home 01/04/2021   Atypical chest pain 01/04/2021   Psychophysiological insomnia 01/04/2021   Microalbuminuria 02/10/2017   Elevated liver enzymes 07/25/2016   Hyperlipidemia 07/25/2016   Morbid obesity with body mass index (BMI) of 50.0 to 59.9 in adult [facility]) 05/20/2014   Morbid obesity (HCC) 01/22/2013   Type 2 diabetes mellitus with hyperglycemia, without long-term current use of insulin (HCC) 05/21/2007     Current Outpatient Medications on File Prior to Visit  Medication Sig Dispense Refill   amoxicillin (AMOXIL) 500 MG capsule Take 1 capsule (500 mg total) by mouth 3 (three) times daily for 7 days. 21 capsule 0   atorvastatin (LIPITOR) 20 MG tablet Take 1  tablet (20 mg total) by mouth daily. 90 tablet 1   Blood Glucose Monitoring Suppl (ONETOUCH VERIO) w/Device KIT Use to check blood sugar once daily. 1 kit 0   chlorhexidine (PERIDEX) 0.12 % solution Swish for 30 sec then spit, 2 (two) times daily. 473 mL 0   dexamethasone (DECADRON) 4 MG tablet Take 1 tablet (4 mg total) by mouth 3 (three) times daily as needed for swelling. 9 tablet 0   Dulaglutide (TRULICITY) 0.75 MG/0.5ML SOPN Inject 0.75 mg into the skin once a week. 6 mL 1   glucose blood (ONETOUCH VERIO) test strip Use to check blood sugar once daily. 100 each 2   ibuprofen (ADVIL) 400 MG tablet Take 1 tablet (400 mg total) by mouth every 4 (four) hours as needed for pain. 30 tablet 0   lidocaine (XYLOCAINE) 2 % solution Rinse and spit with 5mL by mouth 3-4 times daily. 100 mL 1   lisinopril (ZESTRIL) 5 MG tablet Take 1 tablet (5 mg total) by mouth daily. 90 tablet 3   metFORMIN (GLUCOPHAGE) 1000 MG tablet Take 1 tablet (1,000 mg total) by mouth 2 (two) times daily with a meal. 180 tablet 1   OneTouch Delica Lancets 33G MISC Use to check blood sugar once daily. 100 each 2   traMADol (ULTRAM) 50 MG tablet Take 1 tablet by mouth  every 4 to 6 hours as needed pain. 5 tablet 0   No current facility-administered medications on file prior to visit.    No Known Allergies  Social History   Socioeconomic History   Marital status: Married    Spouse name: [name]   Number of children: 3   Years of education: 9   Highest education level: Not on file  Occupational History   Occupation: Housewife  Tobacco Use   Smoking status: Never   Smokeless tobacco: Never  Vaping Use   Vaping Use: Never used  Substance and Sexual Activity   Alcohol use: No    Alcohol/week: 0.0 standard drinks of alcohol   Drug use: No   Sexual activity: Yes    Partners: Male    Birth control/protection: Surgical  Other Topics Concern   Not on file  Social History Narrative   [place] to [place]   Lives at home  with husband who is patient here, Engineer, maintenance, and their 3 children.   Social Determinants of Health   Financial Resource Strain: Low Risk  (08/27/2022)   Overall Financial Resource Strain (CARDIA)    Difficulty of Paying Living Expenses: Not hard at all  Food Insecurity: No Food Insecurity (08/27/2022)   Hunger Vital Sign    Worried About Running Out of Food in the Last Year: Never true    Ran Out of Food in the Last Year: Never true  Transportation Needs: No Transportation Needs (08/27/2022)   PRAPARE - Administrator, Civil Service (Medical): No    Lack of Transportation (Non-Medical): No  Physical Activity: Sufficiently Active (08/27/2022)   Exercise Vital Sign    Days of Exercise per Week: 3 days    Minutes of Exercise per Session: 60 min  Stress: No Stress Concern Present (08/27/2022)   [employer] of Occupational Health - Occupational Stress Questionnaire    Feeling of Stress : Not at all  Social Connections: Moderately Isolated (08/27/2022)   Social Connection and Isolation Panel [NHANES]    Frequency of Communication with Friends and Family: Never    Frequency of Social Gatherings with Friends and Family: More than three times a week    Attends Religious Services: Never    Database administrator or Organizations: No    Attends Banker Meetings: Never    Marital Status: Married  Catering manager Violence: Not At Risk (08/27/2022)   Humiliation, Afraid, Rape, and Kick questionnaire    Fear of Current or Ex-Partner: No    Emotionally Abused: No    Physically Abused: No    Sexually Abused: No    Family History  Problem Relation Age of Onset   Diabetes Mother    Cancer Mother 19       Uterine   HIV/AIDS Brother    Asthma Son     Past Surgical History:  Procedure Laterality Date   CESAREAN SECTION     CESAREAN SECTION     CESAREAN SECTION     TUBAL LIGATION  06/28/2007    ROS: Review of Systems Negative except as stated above  PHYSICAL  EXAM: BP 109/70 (BP Location: Left Arm, Patient Position: Sitting, Cuff Size: Large)   Pulse 71   Temp 98.6 F (37 C) (Oral)   Ht 5\' 1"  (1.549 m)   Wt 279 lb (126.6 kg)   SpO2 96%   BMI 52.72 kg/m   Wt Readings from Last 3 Encounters:  10/01/22 279 lb (126.6 kg)  05/15/22  274 lb (124.3 kg)  04/01/22 278 lb 12.8 oz (126.5 kg)    Physical Exam  General appearance - alert, well appearing, morbidly obese middle-age Hispanic female and in no distress Mental status - normal mood, behavior, speech, dress, motor activity, and thought processes Chest - clear to auscultation, no wheezes, rales or rhonchi, symmetric air entry Heart - normal rate, regular rhythm, normal S1, S2, no murmurs, rubs, clicks or gallops Extremities - peripheral pulses normal, no pedal edema, no clubbing or cyanosis      Latest Ref Rng & Units 05/15/2022    9:25 AM 06/13/2021   10:36 AM 12/11/2020   11:15 AM  CMP  Glucose 70 - 99 mg/dL 161  096  045   BUN 6 - 24 mg/dL 9  8  8    Creatinine 0.57 - 1.00 mg/dL 4.09  8.11  9.14   Sodium 134 - 144 mmol/L 142  141  142   Potassium 3.5 - 5.2 mmol/L 4.2  4.3  4.7   Chloride 96 - 106 mmol/L 106  103  109   CO2 20 - 29 mmol/L 19  22  21    Calcium 8.7 - 10.2 mg/dL 8.8  9.5  8.5   Total Protein 6.0 - 8.5 g/dL 6.9  7.2  6.8   Total Bilirubin 0.0 - 1.2 mg/dL 0.3  0.3  <7.8   Alkaline Phos 44 - 121 IU/L 127  116  107   AST 0 - 40 IU/L 73  37  22   ALT 0 - 32 IU/L 102  35  29    Lipid Panel     Component Value Date/Time   CHOL 154 05/15/2022 0925   TRIG 122 05/15/2022 0925   HDL 44 05/15/2022 0925   CHOLHDL 3.5 05/15/2022 0925   LDLCALC 88 05/15/2022 0925    CBC    Component Value Date/Time   WBC 7.4 05/15/2022 0925   WBC 13.5 (H) 11/25/2013 1806   RBC 4.89 05/15/2022 0925   RBC 4.42 11/25/2013 1806   HGB 13.8 05/15/2022 0925   HCT 43.3 05/15/2022 0925   PLT 378 05/15/2022 0925   MCV 89 05/15/2022 0925   MCH 28.2 05/15/2022 0925   MCH 29.2 11/25/2013 1806    MCHC 31.9 05/15/2022 0925   MCHC 32.6 11/25/2013 1806   RDW 12.2 05/15/2022 0925   LYMPHSABS 2.7 05/15/2022 0925   EOSABS 0.2 05/15/2022 0925   BASOSABS 0.0 05/15/2022 0925    ASSESSMENT AND PLAN: 1. Encounter to establish care  2. Type 2 diabetes mellitus with morbid obesity (HCC) Reported morning blood sugar readings are good. Patient advised to eliminate sugary drinks from the diet, cut back on portion sizes especially of white carbohydrates, eat more white lean meat like chicken [place] and seafood instead of beef or pork and incorporate fresh fruits and vegetables into the diet daily. -Encouraged her to move is much as she can. -I recommend increasing the dose of Trulicity to 1.5 mg once a week to see if it would help decrease appetite and bring about some weight loss.  Went over possible side effects of the medication with increased dosage which can include nausea, vomiting, diarrhea, pain in the upper abdomen to suggest pancreatitis or generalized abdominal pain to suggest bowel blockage.  Advised patient that if she develops any pain in the abdomen, vomiting or severe diarrhea on the medication, she should stop it and let us know. Encouraged her to get eye exam done at a cost-effective  place like [facility]. - Microalbumin / creatinine urine ratio - Dulaglutide (TRULICITY) 1.5 MG/0.5ML SOPN; Inject 1.5 mg into the skin once a week.  Dispense: 2 mL; Refill: 6  3. Hyperlipidemia associated with type 2 diabetes mellitus (HCC) On atorvastatin 20 mg daily.  Will recheck liver function tests as LFTs were elevated in December.  4. Abnormal LFTs - Hepatic Function Panel - Hepatitis C Antibody - Hepatitis B surface antigen - Hepatitis B surface antibody,quantitative  5. Screening for colon cancer Patient agreeable to colon cancer screening with fit test. - Fecal occult blood, imunochemical([facility])  6. Encounter for screening mammogram for malignant neoplasm of  breast Given mammogram scholarship. - MM Digital Screening; Future     Patient was given the opportunity to ask questions.  Patient verbalized understanding of the plan and was able to repeat key elements of the plan.   This documentation was completed using Paediatric nurse.  Any transcriptional errors are unintentional.  No orders of the defined types were placed in this encounter.    Requested Prescriptions    No prescriptions requested or ordered in this encounter    No follow-ups on file.  Jonah Blue, MD, FACP

## 2022-10-02 LAB — HEPATIC FUNCTION PANEL
ALT: 66 IU/L — ABNORMAL HIGH (ref 0–32)
AST: 51 IU/L — ABNORMAL HIGH (ref 0–40)
Albumin: 3.7 g/dL — ABNORMAL LOW (ref 3.9–4.9)
Alkaline Phosphatase: 139 IU/L — ABNORMAL HIGH (ref 44–121)
Bilirubin Total: <0.2 mg/dL (ref 0.0–1.2)
Bilirubin, Direct: <0.1 mg/dL (ref 0.00–0.40)
Total Protein: 6.8 g/dL (ref 6.0–8.5)

## 2022-10-02 LAB — MICROALBUMIN / CREATININE URINE RATIO
Creatinine, Urine: 83.8 mg/dL
Microalb/Creat Ratio: 7 mg/g creat (ref 0–29)
Microalbumin, Urine: 5.7 ug/mL

## 2022-10-02 LAB — HEPATITIS B SURFACE ANTIBODY, QUANTITATIVE: Hepatitis B Surf Ab Quant: <3.5 m[IU]/mL — ABNORMAL LOW (ref >9.9)

## 2022-10-02 LAB — HEPATITIS C ANTIBODY: Hep C Virus Ab: NONREACTIVE

## 2022-10-02 LAB — HEPATITIS B SURFACE ANTIGEN: Hepatitis B Surface Ag: NEGATIVE

## 2022-10-07 ENCOUNTER — Ambulatory Visit: Payer: Self-pay | Attending: Internal Medicine

## 2022-10-07 ENCOUNTER — Telehealth: Payer: Self-pay

## 2022-10-07 DIAGNOSIS — Z23 Encounter for immunization: Secondary | ICD-10-CM

## 2022-10-07 NOTE — Telephone Encounter (Signed)
Patient came in requesting a referral for a mammogram, states she previously had one but wasn't able to call back to make the appointment.

## 2022-10-08 ENCOUNTER — Other Ambulatory Visit: Payer: Self-pay

## 2022-10-09 NOTE — Telephone Encounter (Signed)
Patient has been schedule for a mammogram appointment on 10/31/2022.

## 2022-10-10 ENCOUNTER — Other Ambulatory Visit: Payer: Self-pay

## 2022-10-18 ENCOUNTER — Other Ambulatory Visit: Payer: Self-pay

## 2022-10-22 ENCOUNTER — Other Ambulatory Visit: Payer: Self-pay

## 2022-10-25 ENCOUNTER — Other Ambulatory Visit: Payer: Self-pay

## 2022-11-05 ENCOUNTER — Ambulatory Visit
Admission: RE | Admit: 2022-11-05 | Discharge: 2022-11-05 | Disposition: A | Discharge status: Home / Self Care | Payer: No Typology Code available for payment source | Source: Ambulatory Visit | Attending: Internal Medicine | Admitting: Internal Medicine

## 2022-11-05 DIAGNOSIS — Z1231 Encounter for screening mammogram for malignant neoplasm of breast: Secondary | ICD-10-CM

## 2022-11-07 ENCOUNTER — Ambulatory Visit: Payer: Self-pay | Attending: Internal Medicine

## 2022-11-07 DIAGNOSIS — Z23 Encounter for immunization: Secondary | ICD-10-CM | POA: Insufficient documentation

## 2022-11-07 NOTE — Progress Notes (Signed)
Hepatitis B recombinant vaccine administered Left deltoid per protocols.  Information sheet given. Patient denies and pain or discomfort at injection site. Tolerated injection well no reaction.

## 2022-11-20 ENCOUNTER — Ambulatory Visit: Payer: Self-pay | Attending: Internal Medicine

## 2022-11-26 ENCOUNTER — Ambulatory Visit: Payer: Self-pay | Admitting: Internal Medicine

## 2023-02-25 ENCOUNTER — Ambulatory Visit: Payer: Self-pay | Admitting: Internal Medicine

## 2023-04-23 ENCOUNTER — Ambulatory Visit: Payer: No Typology Code available for payment source | Admitting: Family Medicine

## 2023-05-12 ENCOUNTER — Ambulatory Visit: Payer: Self-pay | Attending: Internal Medicine | Admitting: Internal Medicine

## 2023-05-12 ENCOUNTER — Other Ambulatory Visit: Payer: Self-pay

## 2023-05-12 ENCOUNTER — Other Ambulatory Visit (HOSPITAL_COMMUNITY)
Admission: RE | Admit: 2023-05-12 | Discharge: 2023-05-12 | Disposition: A | Discharge status: Home / Self Care | Payer: No Typology Code available for payment source | Source: Ambulatory Visit | Attending: Internal Medicine | Admitting: Internal Medicine

## 2023-05-12 ENCOUNTER — Other Ambulatory Visit: Payer: Self-pay | Admitting: Pharmacist

## 2023-05-12 ENCOUNTER — Telehealth: Payer: Self-pay | Admitting: Internal Medicine

## 2023-05-12 DIAGNOSIS — N898 Other specified noninflammatory disorders of vagina: Secondary | ICD-10-CM | POA: Insufficient documentation

## 2023-05-12 DIAGNOSIS — E1169 Type 2 diabetes mellitus with other specified complication: Secondary | ICD-10-CM

## 2023-05-12 DIAGNOSIS — E785 Hyperlipidemia, unspecified: Secondary | ICD-10-CM

## 2023-05-12 DIAGNOSIS — Z1211 Encounter for screening for malignant neoplasm of colon: Secondary | ICD-10-CM

## 2023-05-12 DIAGNOSIS — Z7985 Long-term (current) use of injectable non-insulin antidiabetic drugs: Secondary | ICD-10-CM

## 2023-05-12 DIAGNOSIS — E119 Type 2 diabetes mellitus without complications: Secondary | ICD-10-CM

## 2023-05-12 DIAGNOSIS — Z7984 Long term (current) use of oral hypoglycemic drugs: Secondary | ICD-10-CM

## 2023-05-12 LAB — GLUCOSE, POCT (MANUAL RESULT ENTRY): POC Glucose: 287 mg/dL — AB (ref 70–99)

## 2023-05-12 LAB — POCT GLYCOSYLATED HEMOGLOBIN (HGB A1C): HbA1c, POC (controlled diabetic range): 13.7 % — AB (ref 0.0–7.0)

## 2023-05-12 MED ORDER — INSULIN GLARGINE-YFGN 100 UNIT/ML ~~LOC~~ SOPN
12.0000 [IU] | PEN_INJECTOR | Freq: Every day | SUBCUTANEOUS | 2 refills | Status: AC
  Filled 2023-05-12: qty 3, 25d supply, fill #0

## 2023-05-12 MED ORDER — LISINOPRIL 5 MG PO TABS
5.0000 mg | ORAL_TABLET | Freq: Every day | ORAL | 3 refills | Status: AC
  Filled 2023-05-12 – 2023-05-26 (×2): qty 90, 90d supply, fill #0

## 2023-05-12 MED ORDER — PEN NEEDLES 31G X 8 MM MISC
6 refills | Status: AC
  Filled 2023-05-12: qty 100, 90d supply, fill #0

## 2023-05-12 MED ORDER — TRULICITY 1.5 MG/0.5ML ~~LOC~~ SOAJ
1.5000 mg | SUBCUTANEOUS | 6 refills | Status: AC
  Filled 2023-05-12: qty 2, 28d supply, fill #0

## 2023-05-12 MED ORDER — METFORMIN HCL 1000 MG PO TABS
1000.0000 mg | ORAL_TABLET | Freq: Two times a day (BID) | ORAL | 1 refills | Status: AC
  Filled 2023-05-12 – 2023-05-26 (×2): qty 180, 90d supply, fill #0

## 2023-05-12 MED ORDER — LANTUS SOLOSTAR 100 UNIT/ML ~~LOC~~ SOPN
12.0000 [IU] | PEN_INJECTOR | Freq: Every day | SUBCUTANEOUS | 11 refills | Status: DC
  Filled 2023-05-12: qty 3, 25d supply, fill #0

## 2023-05-12 MED ORDER — ATORVASTATIN CALCIUM 20 MG PO TABS
20.0000 mg | ORAL_TABLET | Freq: Every day | ORAL | 1 refills | Status: AC
  Filled 2023-05-12 – 2023-05-26 (×2): qty 90, 90d supply, fill #0

## 2023-05-12 MED ORDER — FLUCONAZOLE 150 MG PO TABS
150.0000 mg | ORAL_TABLET | Freq: Every day | ORAL | 0 refills | Status: DC
  Filled 2023-05-12: qty 1, 1d supply, fill #0

## 2023-05-12 NOTE — Telephone Encounter (Signed)
Unable to reach patient by phone to relay results.  Family member that answered phone will have patient give [place] a call back tomorrow morning.

## 2023-05-12 NOTE — Telephone Encounter (Signed)
Let patient know that I sent prescription to our pharmacy for a medication called Diflucan to treat for possible vaginal yeast infection.

## 2023-05-12 NOTE — Progress Notes (Signed)
Patient ID: Mackenzie Ford, female    DOB: [birth year]  MRN: [id number]  CC: Gynecologic Exam (Pap./Concern about vaginal discharge causing irritation of perineum - previously received medication to help with d/c but after end of treatment d/c returns/Concern about sebum around clitoris /No to flu vax)   Subjective: Mackenzie Ford is a 47 y.o. female who presents for chronic ds management. Her concerns today include:  Patient with history of DM with microalbumin, HL, morbid obesity   AMN Language interpreter used during this encounter. #Viviana [id number]  DM: Results for orders placed or performed in visit on 05/12/23  POCT glucose (manual entry)   Collection Time: 05/12/23 11:02 AM  Result Value Ref Range   POC Glucose 287 (A) 70 - 99 mg/dl  POCT glycosylated hemoglobin (Hb A1C)   Collection Time: 05/12/23 11:10 AM  Result Value Ref Range   Hemoglobin A1C     HbA1c POC (<> result, manual entry)     HbA1c, POC (prediabetic range)     HbA1c, POC (controlled diabetic range) 13.7 (A) 0.0 - 7.0 %  A1C has increased from 7.2 when last  checked in April 2024.  Reports compliance with Metformin 1 gram BID and Trulicity 1.5 mg once a wk -checks BS but not often Wgh down 24 lbs since May 2024. Endorses blurred vision, polyuria; denies polydipsia Reports eating more sugary foods and larger portions Due for eye exam; no insurance. Reports compliance with Lipitor 20 mg daily and Lisinopril 5 mg daily  C/o clear vaginal dischg daily Endorses vaginal itching Was suppose to have pap down today but wants to reschedule for another day due to vaginal irritation.  Reports white discharge that harbors around the clitoris and has done so for many years.  HM:  Reports turning in FIT but no results in system. Patient Active Problem List   Diagnosis Date Noted   Stress at home 01/04/2021   Atypical chest pain 01/04/2021   Psychophysiological insomnia 01/04/2021   Microalbuminuria  02/10/2017   Elevated liver enzymes 07/25/2016   Hyperlipidemia 07/25/2016   Morbid obesity with body mass index (BMI) of 50.0 to 59.9 in adult [facility]) 05/20/2014   Morbid obesity (HCC) 01/22/2013   Type 2 diabetes mellitus with hyperglycemia, without long-term current use of insulin (HCC) 05/21/2007     Current Outpatient Medications on File Prior to Visit  Medication Sig Dispense Refill   amoxicillin (AMOXIL) 500 MG capsule Take 1 capsule (500 mg total) by mouth 3 (three) times daily for 7 days. 21 capsule 0   atorvastatin (LIPITOR) 20 MG tablet Take 1 tablet (20 mg total) by mouth daily. 90 tablet 1   Blood Glucose Monitoring Suppl (ONETOUCH VERIO) w/Device KIT Use to check blood sugar once daily. 1 kit 0   chlorhexidine (PERIDEX) 0.12 % solution Swish for 30 sec then spit, 2 (two) times daily. 473 mL 0   dexamethasone (DECADRON) 4 MG tablet Take 1 tablet (4 mg total) by mouth 3 (three) times daily as needed for swelling. 9 tablet 0   Dulaglutide (TRULICITY) 1.5 MG/0.5ML SOPN Inject 1.5 mg into the skin once a week. 2 mL 6   glucose blood (ONETOUCH VERIO) test strip Use to check blood sugar once daily. 100 each 2   ibuprofen (ADVIL) 400 MG tablet Take 1 tablet (400 mg total) by mouth every 4 (four) hours as needed for pain. 30 tablet 0   lidocaine (XYLOCAINE) 2 % solution Rinse and spit with 5mL by mouth 3-4 times  daily. 100 mL 1   lisinopril (ZESTRIL) 5 MG tablet Take 1 tablet (5 mg total) by mouth daily. 90 tablet 3   metFORMIN (GLUCOPHAGE) 1000 MG tablet Take 1 tablet (1,000 mg total) by mouth 2 (two) times daily with a meal. 180 tablet 1   OneTouch Delica Lancets 33G MISC Use to check blood sugar once daily. 100 each 2   traMADol (ULTRAM) 50 MG tablet Take 1 tablet by mouth every 4 to 6 hours as needed pain. 5 tablet 0   No current facility-administered medications on file prior to visit.    No Known Allergies  Social History   Socioeconomic History   Marital status: Married     Spouse name: [name]   Number of children: 3   Years of education: 9   Highest education level: Not on file  Occupational History   Occupation: Housewife  Tobacco Use   Smoking status: Never   Smokeless tobacco: Never  Vaping Use   Vaping status: Never Used  Substance and Sexual Activity   Alcohol use: No    Alcohol/week: 0.0 standard drinks of alcohol   Drug use: No   Sexual activity: Yes    Partners: Male    Birth control/protection: Surgical  Other Topics Concern   Not on file  Social History Narrative   C[place] to [place]   Lives at home with husband who is patient here, Engineer, maintenance, and their 3 children.   Social Drivers of Corporate investment banker Strain: Low Risk  (08/27/2022)   Overall Financial Resource Strain (CARDIA)    Difficulty of Paying Living Expenses: Not hard at all  Food Insecurity: No Food Insecurity (08/27/2022)   Hunger Vital Sign    Worried About Running Out of Food in the Last Year: Never true    Ran Out of Food in the Last Year: Never true  Transportation Needs: No Transportation Needs (08/27/2022)   PRAPARE - Administrator, Civil Service (Medical): No    Lack of Transportation (Non-Medical): No  Physical Activity: Sufficiently Active (08/27/2022)   Exercise Vital Sign    Days of Exercise per Week: 3 days    Minutes of Exercise per Session: 60 min  Stress: No Stress Concern Present (08/27/2022)   [employer] of Occupational Health - Occupational Stress Questionnaire    Feeling of Stress : Not at all  Social Connections: Moderately Isolated (08/27/2022)   Social Connection and Isolation Panel [NHANES]    Frequency of Communication with Friends and Family: Never    Frequency of Social Gatherings with Friends and Family: More than three times a week    Attends Religious Services: Never    Database administrator or Organizations: No    Attends Banker Meetings: Never    Marital Status: Married  Catering manager Violence: Not At  Risk (08/27/2022)   Humiliation, Afraid, Rape, and Kick questionnaire    Fear of Current or Ex-Partner: No    Emotionally Abused: No    Physically Abused: No    Sexually Abused: No    Family History  Problem Relation Age of Onset   Diabetes Mother    Cancer Mother 84       Uterine   HIV/AIDS Brother    Asthma Son    Breast cancer Neg Hx     Past Surgical History:  Procedure Laterality Date   CESAREAN SECTION     CESAREAN SECTION     CESAREAN SECTION  TUBAL LIGATION  06/28/2007    ROS: Review of Systems Negative except as stated above  PHYSICAL EXAM: BP 115/78 (BP Location: Left Arm, Patient Position: Sitting, Cuff Size: Normal)   Pulse 77   Temp 98.6 F (37 C) (Oral)   Ht 5\' 1"  (1.549 m)   Wt 255 lb (115.7 kg)   LMP 05/03/2023 (Approximate)   SpO2 96%   BMI 48.18 kg/m   Wt Readings from Last 3 Encounters:  05/12/23 255 lb (115.7 kg)  10/01/22 279 lb (126.6 kg)  05/15/22 274 lb (124.3 kg)    Physical Exam  General appearance - alert, well appearing, morbidly obese Hispanic female and in no distress Mental status - normal mood, behavior, speech, dress, motor activity, and thought processes Neck - supple, no significant adenopathy Chest - clear to auscultation, no wheezes, rales or rhonchi, symmetric air entry Heart - normal rate, regular rhythm, normal S1, S2, no murmurs, rubs, clicks or gallops Extremities - peripheral pulses normal, no pedal edema, no clubbing or cyanosis      Latest Ref Rng & Units 10/01/2022   11:45 AM 05/15/2022    9:25 AM 06/13/2021   10:36 AM  CMP  Glucose 70 - 99 mg/dL  086  578   BUN 6 - 24 mg/dL  9  8   Creatinine 4.69 - 1.00 mg/dL  6.29  5.28   Sodium 413 - 144 mmol/L  142  141   Potassium 3.5 - 5.2 mmol/L  4.2  4.3   Chloride 96 - 106 mmol/L  106  103   CO2 20 - 29 mmol/L  19  22   Calcium 8.7 - 10.2 mg/dL  8.8  9.5   Total Protein 6.0 - 8.5 g/dL 6.8  6.9  7.2   Total Bilirubin 0.0 - 1.2 mg/dL <2.4  0.3  0.3   Alkaline  Phos 44 - 121 IU/L 139  127  116   AST 0 - 40 IU/L 51  73  37   ALT 0 - 32 IU/L 66  102  35    Lipid Panel     Component Value Date/Time   CHOL 154 05/15/2022 0925   TRIG 122 05/15/2022 0925   HDL 44 05/15/2022 0925   CHOLHDL 3.5 05/15/2022 0925   LDLCALC 88 05/15/2022 0925    CBC    Component Value Date/Time   WBC 7.4 05/15/2022 0925   WBC 13.5 (H) 11/25/2013 1806   RBC 4.89 05/15/2022 0925   RBC 4.42 11/25/2013 1806   HGB 13.8 05/15/2022 0925   HCT 43.3 05/15/2022 0925   PLT 378 05/15/2022 0925   MCV 89 05/15/2022 0925   MCH 28.2 05/15/2022 0925   MCH 29.2 11/25/2013 1806   MCHC 31.9 05/15/2022 0925   MCHC 32.6 11/25/2013 1806   RDW 12.2 05/15/2022 0925   LYMPHSABS 2.7 05/15/2022 0925   EOSABS 0.2 05/15/2022 0925   BASOSABS 0.0 05/15/2022 0925    ASSESSMENT AND PLAN: 1. Type 2 diabetes mellitus with morbid obesity (HCC) (Primary) Not at goal.  Dietary indiscretions playing a role.  Dietary counseling given.  Encouraged her to cut out sugary sweets and sugary drinks and to decrease her portion sizes. Continue Trulicity 1.5 mg once a week and metformin 1 g twice a day.  Add Lantus insulin 12 units at bedtime.  Clinical pharmacist met with patient today to teach insulin administration.  Went over blood sugar goals with fasting blood sugar goal being 90-130.  Encouraged to check  blood sugars at least twice a day before meals and bring in readings with her on next visit. - POCT glycosylated hemoglobin (Hb A1C) - POCT glucose (manual entry) - CBC - Comprehensive metabolic panel - Lipid panel - insulin glargine (LANTUS SOLOSTAR) 100 UNIT/ML Solostar Pen; Inject 12 Units into the skin at bedtime.  Dispense: 15 mL; Refill: 11 - Insulin Pen Needle (PEN NEEDLES) 31G X 8 MM MISC; use as directed  Dispense: 100 each; Refill: 6  2. Long-term (current) use of injectable non-insulin antidiabetic drugs 3. Diabetes mellitus treated with oral medication (HCC) See #1 above.  4.  Hyperlipidemia associated with type 2 diabetes mellitus (HCC) Continue atorvastatin  5. Vaginal discharge We will have her return in about 4 weeks to do her Pap smear - Cervicovaginal ancillary only  6. Screening for colon cancer - Fecal occult blood, imunochemical([facility])   Patient was given the opportunity to ask questions.  Patient verbalized understanding of the plan and was able to repeat key elements of the plan.   This documentation was completed using Paediatric nurse.  Any transcriptional errors are unintentional.  Orders Placed This Encounter  Procedures   POCT glycosylated hemoglobin (Hb A1C)   POCT glucose (manual entry)     Requested Prescriptions    No prescriptions requested or ordered in this encounter    No follow-ups on file.  Jonah Blue, MD, FACP

## 2023-05-13 LAB — COMPREHENSIVE METABOLIC PANEL
ALT: 122 [IU]/L — ABNORMAL HIGH (ref 0–32)
AST: 95 [IU]/L — ABNORMAL HIGH (ref 0–40)
Albumin: 3.9 g/dL (ref 3.9–4.9)
Alkaline Phosphatase: 172 [IU]/L — ABNORMAL HIGH (ref 44–121)
BUN/Creatinine Ratio: 16 (ref 9–23)
BUN: 9 mg/dL (ref 6–24)
Bilirubin Total: 0.4 mg/dL (ref 0.0–1.2)
CO2: 24 mmol/L (ref 20–29)
Calcium: 9 mg/dL (ref 8.7–10.2)
Chloride: 100 mmol/L (ref 96–106)
Creatinine, Ser: 0.57 mg/dL (ref 0.57–1.00)
Globulin, Total: 3.2 g/dL (ref 1.5–4.5)
Glucose: 308 mg/dL — ABNORMAL HIGH (ref 70–99)
Potassium: 3.9 mmol/L (ref 3.5–5.2)
Sodium: 137 mmol/L (ref 134–144)
Total Protein: 7.1 g/dL (ref 6.0–8.5)
eGFR: 111 mL/min/{1.73_m2} (ref >59)

## 2023-05-13 LAB — CBC
Hematocrit: 48 % — ABNORMAL HIGH (ref 34.0–46.6)
Hemoglobin: 15 g/dL (ref 11.1–15.9)
MCH: 29.1 pg (ref 26.6–33.0)
MCHC: 31.3 g/dL — ABNORMAL LOW (ref 31.5–35.7)
MCV: 93 fL (ref 79–97)
Platelets: 318 10*3/uL (ref 150–450)
RBC: 5.16 x10E6/uL (ref 3.77–5.28)
RDW: 11.8 % (ref 11.7–15.4)
WBC: 7.7 10*3/uL (ref 3.4–10.8)

## 2023-05-13 LAB — CERVICOVAGINAL ANCILLARY ONLY
Bacterial Vaginitis (gardnerella): POSITIVE — AB
Candida Glabrata: POSITIVE — AB
Candida Vaginitis: POSITIVE — AB
Chlamydia: NEGATIVE
Comment: NEGATIVE
Comment: NEGATIVE
Comment: NEGATIVE
Comment: NEGATIVE
Comment: NEGATIVE
Comment: NORMAL
Neisseria Gonorrhea: NEGATIVE
Trichomonas: NEGATIVE

## 2023-05-13 LAB — LIPID PANEL
Chol/HDL Ratio: 3.6 {ratio} (ref 0.0–4.4)
Cholesterol, Total: 190 mg/dL (ref 100–199)
HDL: 53 mg/dL (ref >39)
LDL Chol Calc (NIH): 114 mg/dL — ABNORMAL HIGH (ref 0–99)
Triglycerides: 130 mg/dL (ref 0–149)
VLDL Cholesterol Cal: 23 mg/dL (ref 5–40)

## 2023-05-14 ENCOUNTER — Other Ambulatory Visit: Payer: Self-pay | Admitting: Internal Medicine

## 2023-05-14 DIAGNOSIS — N898 Other specified noninflammatory disorders of vagina: Secondary | ICD-10-CM

## 2023-05-14 MED ORDER — FLUCONAZOLE 150 MG PO TABS: 150.0000 mg | ORAL_TABLET | Freq: Every day | ORAL | 0 refills | Status: AC

## 2023-05-14 MED ORDER — METRONIDAZOLE 500 MG PO TABS
500.0000 mg | ORAL_TABLET | Freq: Two times a day (BID) | ORAL | 0 refills | Status: AC
  Filled 2023-05-14: qty 14, 7d supply, fill #0

## 2023-05-15 ENCOUNTER — Other Ambulatory Visit: Payer: Self-pay

## 2023-05-15 NOTE — Telephone Encounter (Signed)
Called but no answer. LVM to call back.  

## 2023-05-16 NOTE — Telephone Encounter (Signed)
Patient called back and spoke to Gerre Couch, Charity fundraiser. Patient was informed of results & medication sent to pharmacy. Please see lab results for further information.

## 2023-05-20 ENCOUNTER — Other Ambulatory Visit: Payer: Self-pay

## 2023-05-26 ENCOUNTER — Other Ambulatory Visit: Payer: Self-pay

## 2023-06-10 NOTE — Progress Notes (Unsigned)
S:     No chief complaint on file.  48 y.o. 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   Patient was referred and last seen by Primary Care Provider, Dr. Jonah Blue, on 05/12/2023. At that time A1c was 13.7. Patient had complaints of blurred vision and polyuria. Weight decreased by 24 pounds since May 2024. At that time patient also had complaints of vaginal itching and clear vaginal discharge.   Patient has been followed by clinic pharmacist in the past. Last visit with clinic pharmacist occurred in April 2024. She was referred by NP Gwinda Passe for diabetes management. During the visit patient was restarted on Trulicity 0.75 weekly and was instructed to continue metformin 1000 mg BID. A1c was found to be 7.2 during visit on 08/27/2022, decreased from 10.9 in November 2023.   Was scheduled for an appointment with Dr. Laural Benes today at 10 am.   Patient arrives in *** good spirits and presents without *** any assistance. ***Patient is accompanied by ***.   Family/Social History: ***  Current diabetes medications include:  -Trulicity 1.5 mg weekly (last filled June 2024???) -Metformin 1000 mg BID (last filled 05/26/2023 for 90 ds)  Previous diabetes medications: -Glipizide 10 mg BID with meals  -Semglee 12 units daily (not filled due to insurance)  Current hypertension medications include:  -Lisinopril 5 mg every day   Current hyperlipidemia medications include:  -Atorvastatin 20 mg every day   Patient reports adherence to taking all medications as prescribed.  *** Patient denies adherence with medications, reports missing *** medications *** times per week, on average.  Do you feel that your medications are working for you? {YES NO:22349} Have you been experiencing any side effects to the medications prescribed? {YES NO:22349} Do you have  any problems obtaining medications due to transportation or finances? {YES [id number] Insurance coverage: self pay  Patient {Actions; denies-reports:120008} hypoglycemic events.  Reported home fasting blood sugars: ***  Reported 2 hour post-meal/random blood sugars: ***.  Patient {Actions; denies-reports:120008} nocturia (nighttime urination).  Patient {Actions; denies-reports:120008} neuropathy (nerve pain). Patient {Actions; denies-reports:120008} visual changes. Patient {Actions; denies-reports:120008} self foot exams.   Patient reported dietary habits: Eats *** meals/day Breakfast: *** Lunch: *** Dinner: *** Snacks: *** Drinks: ***  Within the past 12 months, did you worry whether your food would run out before you got money to buy more? {YES NO:22349} Within the past 12 months, did the food you bought run out, and you didn't have money to get more? {YES NO:22349} PHQ-9 Score: ***  Patient-reported exercise habits: ***   O:  7 day average blood glucose: ***  Libre3 CGM Download today *** % Time CGM is active: ***% Average Glucose: *** mg/dL Glucose Management Indicator: ***  Glucose Variability: ***% (goal <36%) Time in Goal:  - Time in range 70-180: ***% - Time above range: ***% - Time below range: ***% Observed patterns:   Lab Results  Component Value Date   HGBA1C 13.7 (A) 05/12/2023   There were no vitals filed for this visit.  Lipid Panel     Component Value Date/Time   CHOL 190 05/12/2023 1203   TRIG 130 05/12/2023 1203   HDL 53 05/12/2023 1203   CHOLHDL 3.6 05/12/2023 1203   LDLCALC 114 (H) 05/12/2023 1203    Clinical Atherosclerotic Cardiovascular Disease (ASCVD): No  The 10-year ASCVD risk score (Arnett DK, et al., 2019) is: 2.3%  Values used to calculate the score:     Age: 59 years     Sex: Female     Is Non-Hispanic African American: No     Diabetic: Yes     Tobacco smoker: No     Systolic Blood Pressure: 115 mmHg     Is BP treated:  Yes     HDL Cholesterol: 53 mg/dL     Total Cholesterol: 190 mg/dL   Patient is participating in a Managed Medicaid Plan:  No   A/P: Diabetes longstanding currently uncontrolled. A1c above goal at 13.7, much increased compared to 7.2 in April 2024.  Patient is *** able to verbalize appropriate hypoglycemia management plan. Medication adherence appears poor due to patient being underinsured. Control is suboptimal due to medication access issues. -{Meds adjust:18428} basal insulin *** Lantus/Basaglar/Semglee (insulin glargine) *** Tresiba (insulin degludec) from *** units to *** units daily in the morning. Patient will continue to titrate 1 unit every *** days if fasting blood sugar > 100mg /dl until fasting blood sugars reach goal or next visit.  -{Meds adjust:18428} rapid insulin *** Novolog (insulin aspart) *** Humalog (insulin lispro) from *** to ***.  -{Meds adjust:18428} GLP-1 *** Trulicity (dulaglutide) *** Ozempic (semaglutide) *** Mounjaro (tirzepatide) from *** mg to *** mg .  -Continued metformin 1000 mg BID.  -uACR in May 2024 was 7. eGFR 111 in December 2024.  -Patient educated on purpose, proper use, and potential adverse effects of ***.  -Extensively discussed pathophysiology of diabetes, recommended lifestyle interventions, dietary effects on blood sugar control.  -Counseled on s/sx of and management of hypoglycemia.  -Next A1c anticipated February 2025.   ASCVD risk - primary prevention in patient with diabetes. Last LDL is 114 in December 2024 not at goal of <70 mg/dL. ASCVD risk factors include *** and 10-year ASCVD risk score of 2.3%. Moderate intensity statin indicated.  -Continued Atorvastatin 20 mg.   Written patient instructions provided. Patient verbalized understanding of treatment plan.  Total time in face to face counseling *** minutes.    Follow-up:  Pharmacist *** PCP clinic visit in *** Patient seen with ***  Sofie Rower, PharmD [facility]  PGY-1

## 2023-06-12 ENCOUNTER — Ambulatory Visit: Payer: No Typology Code available for payment source | Attending: Pharmacist | Admitting: Pharmacist

## 2023-06-12 ENCOUNTER — Ambulatory Visit: Payer: No Typology Code available for payment source | Admitting: Internal Medicine

## 2023-07-13 ENCOUNTER — Emergency Department (HOSPITAL_BASED_OUTPATIENT_CLINIC_OR_DEPARTMENT_OTHER): Payer: No Typology Code available for payment source

## 2023-07-13 ENCOUNTER — Emergency Department (HOSPITAL_COMMUNITY)
Admission: EM | Admit: 2023-07-13 | Discharge: 2023-07-13 | Disposition: A | Discharge status: Home / Self Care | Payer: No Typology Code available for payment source | Attending: Emergency Medicine | Admitting: Emergency Medicine

## 2023-07-13 ENCOUNTER — Encounter (HOSPITAL_COMMUNITY): Payer: Self-pay

## 2023-07-13 ENCOUNTER — Emergency Department (HOSPITAL_COMMUNITY): Payer: No Typology Code available for payment source

## 2023-07-13 DIAGNOSIS — X501XXA Overexertion from prolonged static or awkward postures, initial encounter: Secondary | ICD-10-CM | POA: Insufficient documentation

## 2023-07-13 DIAGNOSIS — Z7984 Long term (current) use of oral hypoglycemic drugs: Secondary | ICD-10-CM | POA: Insufficient documentation

## 2023-07-13 DIAGNOSIS — M79661 Pain in right lower leg: Secondary | ICD-10-CM

## 2023-07-13 DIAGNOSIS — E119 Type 2 diabetes mellitus without complications: Secondary | ICD-10-CM | POA: Insufficient documentation

## 2023-07-13 DIAGNOSIS — M25561 Pain in right knee: Secondary | ICD-10-CM | POA: Insufficient documentation

## 2023-07-13 DIAGNOSIS — Z794 Long term (current) use of insulin: Secondary | ICD-10-CM | POA: Insufficient documentation

## 2023-07-13 MED ORDER — LIDOCAINE 5 % EX PTCH
1.0000 | MEDICATED_PATCH | CUTANEOUS | 0 refills | Status: AC
Start: 2023-07-13 — End: ?
  Filled 2023-07-13: qty 30, 30d supply, fill #0

## 2023-07-13 MED ORDER — MELOXICAM 7.5 MG PO TABS
7.5000 mg | ORAL_TABLET | Freq: Every day | ORAL | 0 refills | Status: AC
  Filled 2023-07-13 – 2023-08-11 (×2): qty 14, 14d supply, fill #0

## 2023-07-13 MED ORDER — OXYCODONE-ACETAMINOPHEN 5-325 MG PO TABS
1.0000 | ORAL_TABLET | Freq: Once | ORAL | Status: AC
  Administered 2023-07-13: 1 via ORAL
  Filled 2023-07-13: qty 1

## 2023-07-13 NOTE — ED Provider Notes (Signed)
 Care assumed from previous provider.  See note for full HPI.  In summation 48 year old here for evaluation of right knee pain.  States about a month or 2 ago she had a twisting injury which caused pain.  Her pain had improved however again yesterday she twisted her knee because pain.  She has been ambulatory.  No numbness or weakness.  She has pain more to the posterior aspect of her knee.  No pain or swelling to calf. No CP, SOB  Imaging ordered by previous provider  X-ray without acute fracture, dislocation or effusion  Plan to follow-up on ultrasound.  If ultrasound negative Place patient in sleeve, NSAIDs, follow-up outpatient with Ortho Physical Exam  BP 114/62 (BP Location: Right Arm)   Pulse (!) 58   Temp 98 F (36.7 C) (Oral)   Resp 16   Ht 5\' 1"  (1.549 m)   Wt 115.7 kg   SpO2 100%   BMI 48.18 kg/m   Physical Exam Vitals and nursing note reviewed.  Constitutional:      General: She is not in acute distress.    Appearance: She is well-developed. She is not ill-appearing.  HENT:     Head: Atraumatic.  Eyes:     Pupils: Pupils are equal, round, and reactive to light.  Cardiovascular:     Rate and Rhythm: Normal rate.     Pulses: Normal pulses.          Dorsalis pedis pulses are 2+ on the right side and 2+ on the left side.  Pulmonary:     Effort: Pulmonary effort is normal. No respiratory distress.     Breath sounds: Normal breath sounds.  Abdominal:     General: There is no distension.  Musculoskeletal:        General: Normal range of motion.     Cervical back: Normal range of motion.     Comments: Diffuse tenderness posterior aspect right knee.  No obvious joint effusion.  Able to flex and extend.  Negative varus, valgus stress.  Compartments soft.  Nontender calves bilaterally.  No erythema, warmth  Skin:    General: Skin is warm and dry.  Neurological:     General: No focal deficit present.     Mental Status: She is alert.  Psychiatric:        Mood and  Affect: Mood normal.     Procedures  .Ortho Injury Treatment  Date/Time: 07/13/2023 11:40 PM  Performed by: Linwood Dibbles, PA-C Authorized by: Linwood Dibbles, PA-C   Consent:    Consent obtained:  Verbal   Consent given by:  Patient   Risks discussed:  Irreducible dislocation, recurrent dislocation, restricted joint movement, stiffness, vascular damage, nerve damage and fracture   Alternatives discussed:  No treatment, alternative treatment, immobilization, referral and delayed treatmentInjury location: knee Location details: right knee Injury type: soft tissue Pre-procedure neurovascular assessment: neurovascularly intact Pre-procedure distal perfusion: normal Pre-procedure neurological function: normal Pre-procedure range of motion: normal  Anesthesia: Local anesthesia used: no  Patient sedated: NoImmobilization: brace Splint Applied by: ED Nurse Post-procedure neurovascular assessment: post-procedure neurovascularly intact Post-procedure distal perfusion: normal Post-procedure neurological function: normal Post-procedure range of motion: normal    Labs Reviewed - No data to display VAS [place] LOWER EXTREMITY VENOUS (DVT) (7a-7p) Result Date: 07/13/2023  Lower Venous DVT Study Patient Name:  SILVANNA OHMER  Date of Exam:   07/13/2023 Medical Rec #: [id number]             Accession #:  [id number] Date of Birth: April 16, 1976             Patient Gender: F Patient Age:   63 years Exam Location:  [facility] Procedure:      VAS [place] LOWER EXTREMITY VENOUS (DVT) Referring Phys: Maryanna Shape --------------------------------------------------------------------------------  Indications: Knee pain.  Limitations: Body habitus. Comparison Study: No prior study on file Performing Technologist: Sherren Kerns RVS  Examination Guidelines: A complete evaluation includes B-mode imaging, spectral Doppler, color Doppler, and power Doppler as needed of all accessible portions of each  vessel. Bilateral testing is considered an integral part of a complete examination. Limited examinations for reoccurring indications may be performed as noted. The reflux portion of the exam is performed with the patient in reverse Trendelenburg.  +---------+---------------+---------+-----------+---------------+--------------+ RIGHT    CompressibilityPhasicitySpontaneityProperties     Thrombus Aging +---------+---------------+---------+-----------+---------------+--------------+ CFV      Full           Yes      No         pulsatile                                                                 waveform                      +---------+---------------+---------+-----------+---------------+--------------+ SFJ      Full                                                             +---------+---------------+---------+-----------+---------------+--------------+ FV Prox  Full                                                             +---------+---------------+---------+-----------+---------------+--------------+ FV Mid   Full                                                             +---------+---------------+---------+-----------+---------------+--------------+ FV DistalFull           Yes      No         pulsatile                                                                 waveform                      +---------+---------------+---------+-----------+---------------+--------------+ PFV  Not well                                                                  visualized     +---------+---------------+---------+-----------+---------------+--------------+ POP      Full           Yes      No         pulsatile                                                                 waveform                      +---------+---------------+---------+-----------+---------------+--------------+  PTV                                                        Not well                                                                  visualized     +---------+---------------+---------+-----------+---------------+--------------+ PERO                                                       Not well                                                                  visualized     +---------+---------------+---------+-----------+---------------+--------------+   +----+---------------+---------+-----------+------------------+--------------+ LEFTCompressibilityPhasicitySpontaneityProperties        Thrombus Aging +----+---------------+---------+-----------+------------------+--------------+ CFV Full           Yes      No         pulsatile waveform               +----+---------------+---------+-----------+------------------+--------------+     Summary: RIGHT: - There is no evidence of deep vein thrombosis in the lower extremity. However, portions of this examination were limited- see technologist comments above.  - No cystic structure found in the popliteal fossa. pulsatile waveforms noted throughout  LEFT: - No evidence of common femoral vein obstruction.  Pulsatile waveform noted in CFV.  *See table(s) above for measurements and observations. Electronically signed by Sherald Hess MD on 07/13/2023 at 4:09:04 PM.    Final    DG Knee Complete 4 Views  Right Result Date: 07/13/2023 CLINICAL DATA:  Right knee pain. EXAM: RIGHT KNEE - COMPLETE 4+ VIEW COMPARISON:  Right knee radiographs 10/12/2012 FINDINGS: Normal bone mineralization. Joint spaces are preserved. No joint effusion. No acute fracture or dislocation. IMPRESSION: Normal right knee radiographs. Electronically Signed   By: Neita Garnet M.D.   On: 07/13/2023 13:19    ED Course / MDM   Clinical Course as of 07/13/23 2341  Sun Jul 13, 2023  1512 Twisting injury to knee a month ago. Post right knee, again injury  yesterday. Xray neg. FU on [place]. If neg dc knee sleeve. NSAIDs and Ortho FU. Ambulatory here [BH]    Clinical Course User Index [BH] Lilleigh Hechavarria A, PA-C   Care assumed from previous provider.  See note for full HPI.  In summation 48 year old here for evaluation of right knee pain.  States about a month or 2 ago she had a twisting injury which caused pain.  Her pain had improved however again yesterday she twisted her knee because pain.  She has been ambulatory.  No numbness or weakness.  She has pain more to the posterior aspect of her knee.  No pain or swelling to calf.  Imaging ordered by previous provider  X-ray without acute fracture, dislocation or effusion  Plan to follow-up on ultrasound.  If ultrasound negative Place patient in sleeve, NSAIDs, follow-up outpatient with Ortho  DVT ultrasound reviewed with patient.  Negative for occlusion.  At this time I low suspicion for septic joint, gout, hemarthrosis, occult fracture, ischemia, bacterial infectious process  Patient placed in knee brace.  Discussed NSAIDs for pain management, ice and elevation.  She will follow-up with orthopedics.  No history of GI bleed, ulcers, gastric bypass, CKD.  The patient has been appropriately medically screened and/or stabilized in the ED. I have low suspicion for any other emergent medical condition which would require further screening, evaluation or treatment in the ED or require inpatient management.  Patient is hemodynamically stable and in no acute distress.  Patient able to ambulate in department prior to ED.  Evaluation does not show acute pathology that would require ongoing or additional emergent interventions while in the emergency department or further inpatient treatment.  I have discussed the diagnosis with the patient and answered all questions.  Pain is been managed while in the emergency department and patient has no further complaints prior to discharge.  Patient is comfortable with  plan discussed in room and is stable for discharge at this time.  I have discussed strict return precautions for returning to the emergency department.  Patient was encouraged to follow-up with PCP/specialist refer to at discharge.     Medical Decision Making Amount and/or Complexity of Data Reviewed External Data Reviewed: radiology and notes. Radiology: ordered and independent interpretation performed. Decision-making details documented in ED Course.  Risk OTC drugs. Prescription drug management. Decision regarding hospitalization. Diagnosis or treatment significantly limited by social determinants of health.          Rogue Rafalski A, PA-C 07/13/23 2341    Rexford Maus, DO 07/13/23 2353

## 2023-07-13 NOTE — ED Notes (Signed)
 Pt given ice water and a sandwich bag per EDP.

## 2023-07-13 NOTE — ED Notes (Signed)
 Pt verbalized understanding of discharge instructions. Opportunity for questions provided.

## 2023-07-13 NOTE — Progress Notes (Signed)
 Orthopedic Tech Progress Note Patient Details:  Mackenzie Ford June 02, 1975 [id number]  Ortho Devices Type of Ortho Device: Knee Sleeve Ortho Device/Splint Location: RLE Ortho Device/Splint Interventions: Ordered, Application, Adjustment   Post Interventions Patient Tolerated: Fair Instructions Provided: Care of device, Adjustment of device  [name] 07/13/2023, 6:12 PM

## 2023-07-13 NOTE — Progress Notes (Signed)
 VASCULAR LAB    Right lower extremity venous duplex has been performed.  See CV proc for preliminary results.  Messaged negative results to [employer], PA-C  Gerome Kokesh, RVT 07/13/2023, 4:03 PM

## 2023-07-13 NOTE — ED Notes (Signed)
 Patient transported to Ultrasound

## 2023-07-13 NOTE — Discharge Instructions (Signed)
 Fue un placer atenderte [employer].  Su radiografa no mostr ningn hueso roto. Su ultrasonido no mostr ningn cogulo de [place].  Le hemos colocado un aparato ortopdico para ayudar a estabilizar su rodilla. He escrito pidiendo medicamentos para ayudar con el dolor y la hinchazn. Tmelo segn lo prescrito. Tomar con comida ya que puede causar Programme researcher, broadcasting/film/video.  Seguimiento con el ortopedista. Su nmero figura en su documentacin de alta.  Regrese si los sntomas son nuevos o Psychologist, prison and probation services

## 2023-07-13 NOTE — ED Triage Notes (Signed)
 Pt c/o R knee pain x1 month.  Pain score 8/10.  Pt reports "twisting it."

## 2023-07-13 NOTE — ED Provider Triage Note (Signed)
 Emergency Medicine Provider Triage Evaluation Note  Mackenzie Ford , a 48 y.o. female  was evaluated in triage.  Pt complains of knee pain.  Review of Systems  Positive:  Negative:   Physical Exam  There were no vitals taken for this visit. Gen:   Awake, no distress   Resp:  Normal effort  MSK:   Moves extremities without difficulty  Other:    Medical Decision Making  Medically screening exam initiated at 12:07 PM.  Appropriate orders placed.  Mackenzie Ford was informed that the remainder of the evaluation will be completed by another provider, this initial triage assessment does not replace that evaluation, and the importance of remaining in the ED until their evaluation is complete.  Right knee pain x1 month. Patient stating that she "tweaked" her knee in the shower.    Dorthy Cooler, [place] 07/13/23 1207

## 2023-07-13 NOTE — ED Provider Notes (Signed)
 [facility] Note   CSN: [id number] Arrival date & time: 07/13/23  1141     History Chief Complaint  Patient presents with   Knee Pain    Mackenzie Ford is a 48 y.o. female.  Patient presents to the emergency [facility] with concerns of knee pain.  Past history significant for morbid obesity, type 2 diabetes, hyperlipidemia, and atypical chest pain.  She states that she initially had injured her knee about 1 month ago in which she twisted it causing pain.  States that she felt the knee had somewhat stabilized although is still a little bit uncomfortable when she fell that she twisted it again yesterday causing worsening pain.  No significant direct impact or trauma to the area.  Not on blood thinners no prior history of PE or DVT.  Denies any feelings of tingling, numbness, weakness in the leg.  Does endorse some mild swelling from the knee on down to the right leg.  No obvious skin discoloration.   Knee Pain      Home Medications Prior to Admission medications   Medication Sig Start Date End Date Taking? Authorizing Provider  atorvastatin (LIPITOR) 20 MG tablet Take 1 tablet (20 mg total) by mouth daily. 05/12/23   Marcine Matar, MD  Blood Glucose Monitoring Suppl (ONETOUCH VERIO) w/Device KIT Use to check blood sugar once daily. 05/09/22   Hoy Register, MD  Dulaglutide (TRULICITY) 1.5 MG/0.5ML SOAJ Inject 1.5 mg into the skin once a week. 05/12/23   Marcine Matar, MD  fluconazole (DIFLUCAN) 150 MG tablet Take 1 tablet (150 mg total) by mouth daily. 05/14/23   Marcine Matar, MD  glucose blood (ONETOUCH VERIO) test strip Use to check blood sugar once daily. 05/09/22   Hoy Register, MD  ibuprofen (ADVIL) 400 MG tablet Take 1 tablet (400 mg total) by mouth every 4 (four) hours as needed for pain. 08/13/22     insulin glargine-yfgn (SEMGLEE) 100 UNIT/ML Pen Inject 12 Units into the skin daily. 05/12/23    Marcine Matar, MD  Insulin Pen Needle (PEN NEEDLES) 31G X 8 MM MISC use as directed 05/12/23   Marcine Matar, MD  lidocaine (XYLOCAINE) 2 % solution Rinse and spit with 5mL by mouth 3-4 times daily. 07/16/22     lisinopril (ZESTRIL) 5 MG tablet Take 1 tablet (5 mg total) by mouth daily. 05/12/23   Marcine Matar, MD  metFORMIN (GLUCOPHAGE) 1000 MG tablet Take 1 tablet (1,000 mg total) by mouth 2 (two) times daily with a meal. 05/12/23   Marcine Matar, MD  metroNIDAZOLE (FLAGYL) 500 MG tablet Take 1 tablet (500 mg total) by mouth 2 (two) times daily. 05/14/23   Marcine Matar, MD  OneTouch Delica Lancets 33G MISC Use to check blood sugar once daily. 05/09/22   Hoy Register, MD      Allergies    Patient has no known allergies.    Review of Systems   Review of Systems  Musculoskeletal:        Knee pain  All other systems reviewed and are negative.   Physical Exam Updated Vital Signs BP (!) 116/58 (BP Location: Right Arm)   Pulse 68   Temp 98.9 F (37.2 C) (Oral)   Resp 16   Ht 5\' 1"  (1.549 m)   Wt 115.7 kg   SpO2 99%   BMI 48.18 kg/m  Physical Exam Vitals and nursing note reviewed.  Constitutional:  General: She is not in acute distress.    Appearance: She is well-developed.  HENT:     Head: Normocephalic and atraumatic.  Eyes:     Conjunctiva/sclera: Conjunctivae normal.  Cardiovascular:     Rate and Rhythm: Normal rate and regular rhythm.     Heart sounds: No murmur heard. Pulmonary:     Effort: Pulmonary effort is normal. No respiratory distress.     Breath sounds: Normal breath sounds.  Abdominal:     Palpations: Abdomen is soft.     Tenderness: There is no abdominal tenderness.  Musculoskeletal:        General: Tenderness present. No swelling or deformity.     Cervical back: Neck supple.       Legs:     Comments: Tenderness outpatient in the posterior right knee.  No tenderness to the anterior, lateral, inferior superior aspect of  the knee.  No patellar laxity.  Valgus and varus stress yields pain to the posterior knee.  Negative Lachman.  No significant leg swelling seen.  Skin:    General: Skin is warm and dry.     Capillary Refill: Capillary refill takes less than 2 seconds.  Neurological:     Mental Status: She is alert.  Psychiatric:        Mood and Affect: Mood normal.     ED Results / Procedures / Treatments   Labs (all labs ordered are listed, but only abnormal results are displayed) Labs Reviewed - No data to display  EKG None  Radiology DG Knee Complete 4 Views Right Result Date: 07/13/2023 CLINICAL DATA:  Right knee pain. EXAM: RIGHT KNEE - COMPLETE 4+ VIEW COMPARISON:  Right knee radiographs 10/12/2012 FINDINGS: Normal bone mineralization. Joint spaces are preserved. No joint effusion. No acute fracture or dislocation. IMPRESSION: Normal right knee radiographs. Electronically Signed   By: Neita Garnet M.D.   On: 07/13/2023 13:19    Procedures Procedures    Medications Ordered in ED Medications  oxyCODONE-acetaminophen (PERCOCET/ROXICET) 5-325 MG per tablet 1 tablet (1 tablet Oral Given 07/13/23 1400)    ED Course/ Medical Decision Making/ A&P Clinical Course as of 07/13/23 1528  Sun Jul 13, 2023  1512 Twisting injury to knee a month ago. Post right knee, again injury yesterday. Xray neg. FU on [place]. If neg dc knee sleeve. NSAIDs and Ortho FU. Ambulatory here [BH]    Clinical Course User Index [BH] Henderly, Britni A, PA-C                                 Medical Decision Making Risk Prescription drug management.   This patient presents to the ED for concern of knee pain.  Differential diagnosis includes ligamental injury of the knee, patellar dislocation, arthritis, DVT    Imaging Studies ordered:  I ordered imaging studies including x-ray of the right knee, ultrasound of the right lower extremity I independently visualized and interpreted imaging which showed x-ray negative  for any acute findings, ultrasound pending at time of handoff I agree with the radiologist interpretation   Medicines ordered and prescription drug management:  I ordered medication including Percocet for pain Reevaluation of the patient after these medicines showed that the patient improved I have reviewed the patients home medicines and have made adjustments as needed   Problem List / ED Course:  Patient presents to the emergency [facility] today with concerns of knee pain.  History of type 2  diabetes, morbid obesity, hyperlipidemia, atypical chest pain.  She states that she injured her knee about 1 month ago in which she twisted it.  States that she had improvement in symptoms but reinjured the knee yesterday.  States that during the time which her knee had improved, she started some discomfort with walking.  No prior history of PE or DVT.  Has tried taking Tylenol with minimal improvement in symptoms.  Rest, elevation, and icing the area has not been helpful. On exam, there is significant tenderness to palpation in the posterior aspect of the right knee.  No significant knee deformity, laxity, or normal knee stress testing seen with exception of some limitations due to pain in the posterior knee.  No bruising appreciated no palpable deformity or mass.  X-ray ridging ordered from triage is reassuring with no acute findings seen.  On my evaluation, I do have some concerns for possible risk of a DVT in the setting of injury to the knee about 1 month ago.  Will obtain ultrasound to rule out possible DVT.  3:28 PM Care of Joselin Crandell transferred to [facility] and Dr. Theresia Lo at the end of my shift as the patient will require reassessment once labs/imaging have resulted. Patient presentation, ED course, and plan of care discussed with review of all pertinent labs and imaging. Please see his/her note for further details regarding further ED course and disposition. Plan at time of handoff is dispo per  [place] results. If negative, a sleeve and outpatient PCP/ortho follow is reasonable. This may be altered or completely changed at the discretion of the oncoming team pending results of further workup.   Final Clinical Impression(s) / ED Diagnoses Final diagnoses:  None    Rx / DC Orders ED Discharge Orders     None         Smitty Knudsen, PA-C 07/13/23 1528    Gwyneth Sprout, MD 07/17/23 [phone]

## 2023-07-13 NOTE — ED Notes (Signed)
 Ortho Tech called to apply knee brace.

## 2023-07-14 ENCOUNTER — Other Ambulatory Visit: Payer: Self-pay

## 2023-07-18 ENCOUNTER — Other Ambulatory Visit: Payer: Self-pay

## 2023-07-25 ENCOUNTER — Other Ambulatory Visit: Payer: Self-pay

## 2023-08-11 ENCOUNTER — Other Ambulatory Visit: Payer: Self-pay

## 2023-08-11 ENCOUNTER — Other Ambulatory Visit (HOSPITAL_COMMUNITY): Payer: Self-pay

## 2023-12-01 ENCOUNTER — Encounter: Payer: Self-pay | Admitting: Internal Medicine

## 2023-12-01 ENCOUNTER — Ambulatory Visit: Payer: Self-pay | Attending: Internal Medicine | Admitting: Internal Medicine

## 2023-12-01 ENCOUNTER — Other Ambulatory Visit: Payer: Self-pay

## 2023-12-01 VITALS — BP 115/73 | HR 60 | Resp 18 | Ht 61.0 in | Wt 274.8 lb

## 2023-12-01 DIAGNOSIS — M5416 Radiculopathy, lumbar region: Secondary | ICD-10-CM

## 2023-12-01 DIAGNOSIS — M25561 Pain in right knee: Secondary | ICD-10-CM

## 2023-12-01 MED ORDER — GABAPENTIN 300 MG PO CAPS
300.0000 mg | ORAL_CAPSULE | Freq: Two times a day (BID) | ORAL | 3 refills | Status: AC
  Filled 2023-12-01: qty 60, 30d supply, fill #0

## 2023-12-01 MED ORDER — CELECOXIB 100 MG PO CAPS
100.0000 mg | ORAL_CAPSULE | Freq: Two times a day (BID) | ORAL | 1 refills | Status: AC
Start: 2023-12-01 — End: ?
  Filled 2023-12-01: qty 60, 30d supply, fill #0

## 2023-12-01 NOTE — Progress Notes (Signed)
 Patient ID: [facility], female    DOB: 1976-04-13  MRN: [id number]  CC: Knee Pain   Subjective: Mackenzie [facility] is a 48 y.o. female who presents for acute visit management. Her concerns today include:  Patient with history of DM with microalbumin, HL, morbid obesity   [employer] Language interpreter used during this encounter. #Iraida [id number]  Pt c/o RT sided knee pain x 3 mths -pain is intermittent; lasting > 1hr when it comes on. Worse if I step down wrongly or I trip over something. -no swelling in the knee except when she bumped her knee into a metal some months ago while walking pass a car.  Pain had been going on prior to that however.  Seen in the emergency room back in February for this knee pain and had x-ray done of the knee that was negative.  Also had a Doppler ultrasound done that was negative for any blood clot.  Worse after sitting for long time. Not a very strong pain that is unbearable but is there. -no stiffness  Also c/o pain constant LT lower back x 4 yrs No initiating factors. Pain radiates down the back and side of the LT leg; feels like a ball or lump on the calf No numbness or tingling but burning sensation. No weakness or falls.  Nothing makes it worse; has to sleep in recliner because it hurts more when she sleeps in bed; worse when walking as well  Standing makes it a little better. Does not work.  Volunteers at [employer] but does not do any lifting in this capacity. Rates pain 10/10. No taking anything for pain   Patient Active Problem List   Diagnosis Date Noted   Stress at home 01/04/2021   Atypical chest pain 01/04/2021   Psychophysiological insomnia 01/04/2021   Microalbuminuria 02/10/2017   Elevated liver enzymes 07/25/2016   Hyperlipidemia 07/25/2016   Morbid obesity with body mass index (BMI) of 50.0 to 59.9 in adult [facility]) 05/20/2014   Morbid obesity (HCC) 01/22/2013   Type 2 diabetes mellitus with hyperglycemia, without  long-term current use of insulin  (HCC) 05/21/2007     Current Outpatient Medications on File Prior to Visit  Medication Sig Dispense Refill   atorvastatin  (LIPITOR) 20 MG tablet Take 1 tablet (20 mg total) by mouth daily. 90 tablet 1   Blood Glucose Monitoring Suppl (ONETOUCH VERIO) w/Device KIT Use to check blood sugar once daily. 1 kit 0   Dulaglutide  (TRULICITY ) 1.5 MG/0.5ML SOAJ Inject 1.5 mg into the skin once a week. 2 mL 6   fluconazole  (DIFLUCAN ) 150 MG tablet Take 1 tablet (150 mg total) by mouth daily. 1 tablet 0   glucose blood (ONETOUCH VERIO) test strip Use to check blood sugar once daily. 100 each 2   ibuprofen  (ADVIL ) 400 MG tablet Take 1 tablet (400 mg total) by mouth every 4 (four) hours as needed for pain. 30 tablet 0   insulin  glargine-yfgn (SEMGLEE ) 100 UNIT/ML Pen Inject 12 Units into the skin daily. 3 mL 2   Insulin  Pen Needle (PEN NEEDLES) 31G X 8 MM MISC use as directed 100 each 6   lidocaine  (LIDODERM ) 5 % Place 1 patch onto the skin daily. Remove & Discard patch within 12 hours or as directed by MD 30 patch 0   lidocaine  (XYLOCAINE ) 2 % solution Rinse and spit with 5mL by mouth 3-4 times daily. 100 mL 1   lisinopril  (ZESTRIL ) 5 MG tablet Take 1 tablet (5 mg  total) by mouth daily. 90 tablet 3   metFORMIN  (GLUCOPHAGE ) 1000 MG tablet Take 1 tablet (1,000 mg total) by mouth 2 (two) times daily with a meal. 180 tablet 1   metroNIDAZOLE  (FLAGYL ) 500 MG tablet Take 1 tablet (500 mg total) by mouth 2 (two) times daily. 14 tablet 0   OneTouch Delica Lancets 33G MISC Use to check blood sugar once daily. 100 each 2   No current facility-administered medications on file prior to visit.    No Known Allergies  Social History   Socioeconomic History   Marital status: Married    Spouse name: [name]   Number of children: 3   Years of education: 9   Highest education level: Not on file  Occupational History   Occupation: Housewife  Tobacco Use   Smoking status: Never    Smokeless tobacco: Never  Vaping Use   Vaping status: Never Used  Substance and Sexual Activity   Alcohol use: No    Alcohol/week: 0.0 standard drinks of alcohol   Drug use: No   Sexual activity: Yes    Partners: Male    Birth control/protection: Surgical  Other Topics Concern   Not on file  Social History Narrative   C[place]   Lives at home with husband who is patient here, Engineer, maintenance, and their 3 children.   Social Drivers of Corporate investment banker Strain: Low Risk  (08/27/2022)   Overall Financial Resource Strain (CARDIA)    Difficulty of Paying Living Expenses: Not hard at all  Food Insecurity: No Food Insecurity (08/27/2022)   Hunger Vital Sign    Worried About Running Out of Food in the Last Year: Never true    Ran Out of Food in the Last Year: Never true  Transportation Needs: No Transportation Needs (08/27/2022)   PRAPARE - Administrator, Civil Service (Medical): No    Lack of Transportation (Non-Medical): No  Physical Activity: Sufficiently Active (08/27/2022)   Exercise Vital Sign    Days of Exercise per Week: 3 days    Minutes of Exercise per Session: 60 min  Stress: No Stress Concern Present (08/27/2022)   [employer] of Occupational Health - Occupational Stress Questionnaire    Feeling of Stress : Not at all  Social Connections: Moderately Isolated (08/27/2022)   Social Connection and Isolation Panel    Frequency of Communication with Friends and Family: Never    Frequency of Social Gatherings with Friends and Family: More than three times a week    Attends Religious Services: Never    Database administrator or Organizations: No    Attends Banker Meetings: Never    Marital Status: Married  Catering manager Violence: Not At Risk (08/27/2022)   Humiliation, Afraid, Rape, and Kick questionnaire    Fear of Current or Ex-Partner: No    Emotionally Abused: No    Physically Abused: No    Sexually Abused: No    Family History   Problem Relation Age of Onset   Diabetes Mother    Cancer Mother 7       Uterine   HIV/AIDS Brother    Asthma Son    Breast cancer Neg Hx     Past Surgical History:  Procedure Laterality Date   CESAREAN SECTION     CESAREAN SECTION     CESAREAN SECTION     TUBAL LIGATION  06/28/2007    ROS: Review of Systems Negative except as stated above  PHYSICAL EXAM: BP 115/73 (BP Location: Left Arm, Patient Position: Sitting, Cuff Size: Large)   Pulse 60   Resp 18   Ht 5' 1 (1.549 m)   Wt 274 lb 12.8 oz (124.6 kg)   SpO2 97%   BMI 51.92 kg/m   Physical Exam  General appearance - alert, well appearing morbidly obese middle-age Hispanic female, and in no distress Mental status - normal mood, behavior, speech, dress, motor activity, and thought processes Neurological -power in both lower extremities 4/5 proximally and distally.  She gives to pain on both sides on the right side because of her knee and on the left side due to increased pain in the back.  Gross sensation intact in both lower extremities.  She ambulates unassisted but with a slight limp favoring her right knee. Musculoskeletal -no tenderness on palpation of the lumbar spine and surrounding paraspinal muscles. Right knee: No tenderness on palpation of the medial and lateral joint lines.  She has mild to moderate discomfort with attempted passive range of motion.     Latest Ref Rng & Units 05/12/2023   12:03 PM 10/01/2022   11:45 AM 05/15/2022    9:25 AM  CMP  Glucose 70 - 99 mg/dL 691   875   BUN 6 - 24 mg/dL 9   9   Creatinine 9.42 - 1.00 mg/dL 9.42   9.46   Sodium 865 - 144 mmol/L 137   142   Potassium 3.5 - 5.2 mmol/L 3.9   4.2   Chloride 96 - 106 mmol/L 100   106   CO2 20 - 29 mmol/L 24   19   Calcium  8.7 - 10.2 mg/dL 9.0   8.8   Total Protein 6.0 - 8.5 g/dL 7.1  6.8  6.9   Total Bilirubin 0.0 - 1.2 mg/dL 0.4  <9.7  0.3   Alkaline Phos 44 - 121 IU/L 172  139  127   AST 0 - 40 IU/L 95  51  73   ALT 0 - 32  IU/L 122  66  102    Lipid Panel     Component Value Date/Time   CHOL 190 05/12/2023 1203   TRIG 130 05/12/2023 1203   HDL 53 05/12/2023 1203   CHOLHDL 3.6 05/12/2023 1203   LDLCALC 114 (H) 05/12/2023 1203    CBC    Component Value Date/Time   WBC 7.7 05/12/2023 1203   WBC 13.5 (H) 11/25/2013 1806   RBC 5.16 05/12/2023 1203   RBC 4.42 11/25/2013 1806   HGB 15.0 05/12/2023 1203   HCT 48.0 (H) 05/12/2023 1203   PLT 318 05/12/2023 1203   MCV 93 05/12/2023 1203   MCH 29.1 05/12/2023 1203   MCH 29.2 11/25/2013 1806   MCHC 31.3 (L) 05/12/2023 1203   MCHC 32.6 11/25/2013 1806   RDW 11.8 05/12/2023 1203   LYMPHSABS 2.7 05/15/2022 0925   EOSABS 0.2 05/15/2022 0925   BASOSABS 0.0 05/15/2022 0925    ASSESSMENT AND PLAN: 1. Lumbar radiculopathy (Primary) Possible slipped disc.  Being morbidly obese with a huge protruding hanging abdomen does not help. Recommend that we do an MRI of the lumbar spine but she has to apply for the [facility] discount.  She plans to pick up forms today on her way out.  We will bring her back in 6 weeks at that time hopefully she has been approved. -In the meantime I recommend no work lifting or excessive pushing or pulling. -Use a heating pad to  the back. Start Celebrex  100 mg twice a day. Start gabapentin  300 mg daily.  Advised that it can cause some drowsiness. - celecoxib  (CELEBREX ) 100 MG capsule; Take 1 capsule (100 mg total) by mouth 2 (two) times daily.  Dispense: 60 capsule; Refill: 1 - gabapentin  (NEURONTIN ) 300 MG capsule; 1 tab PO at bedtime x 1 wk then twice a day  Dispense: 60 capsule; Refill: 3  2. Right knee pain, unspecified chronicity Likely some underlying osteoarthritis even though x-ray of the right knee done in February of this year was negative.  Stressed the importance of weight loss. - AMB referral to orthopedics - celecoxib  (CELEBREX ) 100 MG capsule; Take 1 capsule (100 mg total) by mouth 2 (two) times daily.  Dispense: 60 capsule;  Refill: 1   Patient was given the opportunity to ask questions.  Patient verbalized understanding of the plan and was able to repeat key elements of the plan.   This documentation was completed using Paediatric nurse.  Any transcriptional errors are unintentional.  Orders Placed This Encounter  Procedures   AMB referral to orthopedics     Requested Prescriptions   Signed Prescriptions Disp Refills   celecoxib  (CELEBREX ) 100 MG capsule 60 capsule 1    Sig: Take 1 capsule (100 mg total) by mouth 2 (two) times daily.   gabapentin  (NEURONTIN ) 300 MG capsule 60 capsule 3    Sig: Take 1 capsule (300 mg total) by mouth at bedtime for 1 week, then take 1 capsule (300 mg total) by mouth 2 (two) times daily.    Return in about 6 weeks (around 01/12/2024) for f/u lumbar radiculopathy/DM/HL.SABRA  Barnie Louder, MD, FACP

## 2023-12-01 NOTE — Progress Notes (Signed)
 Pt is here for knee pain   Complaining of right knee pain for X3 months   Pain is constant and described as sharp, shooting pain per pt

## 2023-12-08 ENCOUNTER — Ambulatory Visit: Admitting: Pharmacist

## 2023-12-11 ENCOUNTER — Ambulatory Visit: Admitting: Physician Assistant

## 2024-01-16 ENCOUNTER — Telehealth: Payer: Self-pay | Admitting: Internal Medicine

## 2024-01-16 NOTE — Telephone Encounter (Signed)
 Called patient to confirm upcoming appointment 01/20/2024. Patient requested to cancel appointment, will call to reschedule appointment.

## 2024-01-20 ENCOUNTER — Ambulatory Visit: Admitting: Internal Medicine
